# Patient Record
Sex: Female | Born: 1970 | Race: White | Hispanic: No | Marital: Single | Smoking: Never smoker
Health system: Southern US, Community
[De-identification: ages and names within clinical notes are randomized; demographics above are authoritative.]

## PROBLEM LIST (undated history)

## (undated) DIAGNOSIS — J45909 Unspecified asthma, uncomplicated: Secondary | ICD-10-CM

## (undated) DIAGNOSIS — H9190 Unspecified hearing loss, unspecified ear: Secondary | ICD-10-CM

## (undated) DIAGNOSIS — E7800 Pure hypercholesterolemia, unspecified: Secondary | ICD-10-CM

## (undated) DIAGNOSIS — F419 Anxiety disorder, unspecified: Secondary | ICD-10-CM

## (undated) HISTORY — PX: BREAST SURGERY: SHX581

---

## 2002-03-07 ENCOUNTER — Encounter: Payer: Self-pay | Admitting: Internal Medicine

## 2002-03-07 ENCOUNTER — Ambulatory Visit (HOSPITAL_COMMUNITY): Admission: RE | Admit: 2002-03-07 | Discharge: 2002-03-07 | Payer: Self-pay | Admitting: Internal Medicine

## 2002-03-14 ENCOUNTER — Ambulatory Visit (HOSPITAL_COMMUNITY): Admission: RE | Admit: 2002-03-14 | Discharge: 2002-03-14 | Payer: Self-pay | Admitting: Family Medicine

## 2002-03-14 ENCOUNTER — Encounter: Payer: Self-pay | Admitting: Family Medicine

## 2002-05-12 ENCOUNTER — Encounter: Payer: Self-pay | Admitting: Family Medicine

## 2002-05-12 ENCOUNTER — Ambulatory Visit (HOSPITAL_COMMUNITY): Admission: RE | Admit: 2002-05-12 | Discharge: 2002-05-12 | Payer: Self-pay | Admitting: Family Medicine

## 2002-08-10 ENCOUNTER — Encounter: Admission: RE | Admit: 2002-08-10 | Discharge: 2002-08-10 | Payer: Self-pay | Admitting: Obstetrics and Gynecology

## 2002-08-10 ENCOUNTER — Encounter: Payer: Self-pay | Admitting: Obstetrics and Gynecology

## 2002-08-21 ENCOUNTER — Encounter (HOSPITAL_BASED_OUTPATIENT_CLINIC_OR_DEPARTMENT_OTHER): Payer: Self-pay | Admitting: General Surgery

## 2002-08-23 ENCOUNTER — Ambulatory Visit (HOSPITAL_COMMUNITY): Admission: RE | Admit: 2002-08-23 | Discharge: 2002-08-23 | Payer: Self-pay | Admitting: General Surgery

## 2002-08-23 ENCOUNTER — Encounter (INDEPENDENT_AMBULATORY_CARE_PROVIDER_SITE_OTHER): Payer: Self-pay | Admitting: Specialist

## 2003-02-26 ENCOUNTER — Other Ambulatory Visit: Admission: RE | Admit: 2003-02-26 | Discharge: 2003-02-26 | Payer: Self-pay | Admitting: Obstetrics and Gynecology

## 2004-03-13 ENCOUNTER — Other Ambulatory Visit: Admission: RE | Admit: 2004-03-13 | Discharge: 2004-03-13 | Payer: Self-pay | Admitting: Obstetrics and Gynecology

## 2004-09-16 ENCOUNTER — Ambulatory Visit (HOSPITAL_COMMUNITY): Admission: RE | Admit: 2004-09-16 | Discharge: 2004-09-16 | Payer: Self-pay | Admitting: Family Medicine

## 2005-01-23 ENCOUNTER — Ambulatory Visit (HOSPITAL_COMMUNITY): Admission: RE | Admit: 2005-01-23 | Discharge: 2005-01-23 | Payer: Self-pay | Admitting: Family Medicine

## 2005-03-12 ENCOUNTER — Emergency Department (HOSPITAL_COMMUNITY): Admission: EM | Admit: 2005-03-12 | Discharge: 2005-03-12 | Payer: Self-pay | Admitting: Emergency Medicine

## 2005-03-19 ENCOUNTER — Ambulatory Visit (HOSPITAL_COMMUNITY): Admission: RE | Admit: 2005-03-19 | Discharge: 2005-03-19 | Payer: Self-pay | Admitting: Family Medicine

## 2005-03-23 ENCOUNTER — Ambulatory Visit (HOSPITAL_COMMUNITY): Admission: RE | Admit: 2005-03-23 | Discharge: 2005-03-23 | Payer: Self-pay | Admitting: Family Medicine

## 2006-07-20 ENCOUNTER — Ambulatory Visit (HOSPITAL_COMMUNITY): Admission: RE | Admit: 2006-07-20 | Discharge: 2006-07-20 | Payer: Self-pay | Admitting: Family Medicine

## 2007-01-07 ENCOUNTER — Emergency Department (HOSPITAL_COMMUNITY): Admission: EM | Admit: 2007-01-07 | Discharge: 2007-01-07 | Payer: Self-pay | Admitting: Emergency Medicine

## 2009-02-07 ENCOUNTER — Ambulatory Visit (HOSPITAL_COMMUNITY): Admission: RE | Admit: 2009-02-07 | Discharge: 2009-02-07 | Payer: Self-pay | Admitting: Internal Medicine

## 2010-06-06 NOTE — Procedures (Signed)
Nicole Wolfe, Nicole Wolfe             ACCOUNT NO.:  [id number]   MEDICAL RECORD NO.:  [id number]          PATIENT TYPE:  OUT   LOCATION:  [facility]                           FACILITY:  [facility]   PHYSICIAN:  Edward L. Juanetta Gosling, M.D.DATE OF BIRTH:  Sep 15, 1970   DATE OF PROCEDURE:  03/19/2005  DATE OF DISCHARGE:                              PULMONARY FUNCTION TEST   RESULTS:  1.  Spirometry is normal.  2.  Lung volumes are normal.  3.  Diffusion capacity of carbon monoxide (DLCO) is normal.  4.  Arterial blood gas is consistent with mild hyperventilation, otherwise      normal.      Edward L. Juanetta Gosling, M.D.  Electronically Signed     ELH/MEDQ  D:  03/26/2005  T:  03/26/2005  Job:  [id number]

## 2010-06-06 NOTE — Op Note (Signed)
[name], Nicole Wolfe                       ACCOUNT NO.:  [id number]   MEDICAL RECORD NO.:  [id number]                   PATIENT TYPE:  OIB   LOCATION:  2899                                 FACILITY:  [facility]   PHYSICIAN:  Leonie Man, M.D.                DATE OF BIRTH:  12/21/1970   DATE OF PROCEDURE:  08/23/2002  DATE OF DISCHARGE:  08/23/2002                                 OPERATIVE REPORT   PREOPERATIVE DIAGNOSIS:  Duct papillomatosis, right breast.   POSTOPERATIVE DIAGNOSIS:  Duct papillomatosis, right breast.   PROCEDURE:  Major duct excision, right breast.   SURGEON:  Mardene Celeste. Lurene Shadow, M.D.   ASSISTANT:  Nurse   ANESTHESIA:  General.   [name] is a 40 year old female presenting with right sided nipple  discharge which is heme positive.  She underwent a ductogram which showed  two filling defects within the major duct beneath the nipple.  At the time  of evaluation, there was no discharge and today at surgery, there was also  no discharge noted.  The patient was advised that the entire duct system  from under the nipple would be removed under the circumstances.  She  understands this and comes to the operating room for major duct excision.   PROCEDURE:  Following the induction of satisfactory general anesthesia, the  patient was positioned supine and the right breast was prepped and draped to  be included in the sterile operative field.  I made a circumareolar incision  on the inferior border of the areola and deepened through the skin and  subcutaneous tissue.  The skin of the nipple was raised cephalad as a flap  and dissection was carried down around carrying the dissection up to the  major duct system.  The duct system was dissected free from the under  surface of the nipple and carried down into the breast tissues for  approximately 5 cm deep to the central portion of the breast.  This was  removed in its entirety and forwarded for pathological  evaluation.  Hemostasis was then obtained with electrocautery.  Sponge, instrument and  sharp counts were verified.  The breast tissues were reapproximated with  concentric purse-string sutures of 2-0 Vicryl.  The subcutaneous tissue were  closed with interrupted 3-0 Vicryl sutures and the skin was closed with  running 5-0 Monocryl suture.  The wound was then reinforced with Steri-  Strips.  A sterile dressing was applied, the anesthetic reversed, and the  patient removed from the operating room to the recovery room in stable  condition, having tolerated the procedure well.                                               Leonie Man, M.D.    PB/MEDQ  D:  08/23/2002  T:  08/23/2002  Job:  [id number]

## 2012-02-28 ENCOUNTER — Encounter (HOSPITAL_COMMUNITY): Payer: Self-pay | Admitting: Emergency Medicine

## 2012-02-28 ENCOUNTER — Emergency Department (INDEPENDENT_AMBULATORY_CARE_PROVIDER_SITE_OTHER)
Admission: EM | Admit: 2012-02-28 | Discharge: 2012-02-28 | Disposition: A | Discharge status: Home / Self Care | Payer: Worker's Compensation | Source: Home / Self Care

## 2012-02-28 ENCOUNTER — Emergency Department (INDEPENDENT_AMBULATORY_CARE_PROVIDER_SITE_OTHER): Payer: Worker's Compensation

## 2012-02-28 DIAGNOSIS — M25539 Pain in unspecified wrist: Secondary | ICD-10-CM

## 2012-02-28 HISTORY — DX: Unspecified asthma, uncomplicated: J45.909

## 2012-02-28 HISTORY — DX: Anxiety disorder, unspecified: F41.9

## 2012-02-28 HISTORY — DX: Pure hypercholesterolemia, unspecified: E78.00

## 2012-02-28 HISTORY — DX: Unspecified hearing loss, unspecified ear: H91.90

## 2012-02-28 MED ORDER — RANITIDINE HCL 150 MG PO TABS: 150.0000 mg | ORAL_TABLET | Freq: Two times a day (BID) | ORAL | Status: DC

## 2012-02-28 NOTE — ED Notes (Signed)
Injured right wrist at work per patient.  Reports pushing a "fixture" at work.  Reports tingling sensation and increase in symptoms with the more she used wrist.

## 2012-02-28 NOTE — ED Provider Notes (Signed)
Nicole Wolfe is a 42 y.o. female who presents to Urgent Care today for right wrist pain starting this morning. Patient was working at [employer] pushing a cart. Following pushing a cart she noted older sided right wrist pain and some tingling and numbness sensations radiating to her second third and fourth digits.  She describes the tingling sensation is volar side. Her wrist pain is worse with ulnar deviation and flexion. She denies any falls or history is significant pain prior to this episode. She has not tried any medication yet.     PMH reviewed. Significant for hyperlipidemia, and anxiety History  Substance Use Topics  . Smoking status: Never Smoker   . Smokeless tobacco: Not on file  . Alcohol Use: No   works at [employer], unloads trucks ROS as above Medications reviewed. No current facility-administered medications for this encounter.   Current Outpatient Prescriptions  Medication Sig Dispense Refill  . Loratadine (CLARITIN PO) Take by mouth.      . sertraline (ZOLOFT) 50 MG tablet Take 50 mg by mouth daily.      Marland Kitchen SIMVASTATIN PO Take by mouth.      . ranitidine (ZANTAC) 150 MG tablet Take 1 tablet (150 mg total) by mouth 2 (two) times daily.  60 tablet  0    Exam:  BP 144/90  Pulse 91  Temp(Src) 97.8 F (36.6 C) (Oral)  Resp 21  SpO2 100%  LMP 02/21/2012 Gen: Well NAD RIGHT WRIST: Normal-appearing no swelling Mildly tender TFCC region. Nontender over anatomical snuff box.  Normal sensation pulses capillary refill and grip strength Positive Tinel's and Phalen's test right-sided. Neck: Nontender over spinal midline. Normal neck range of motion. Negative Spurling's test bilaterally.  No results found for this or any previous visit (from the past 24 hour(s)). Dg Wrist Complete Right  02/28/2012  *RADIOLOGY REPORT*  Clinical Data: Right wrist pain, pushing injury  RIGHT WRIST - COMPLETE 3+ VIEW  Comparison: None.  Findings: Multiple views of the right wrist including dedicated  scaphoid view demonstrate no evidence of acute fracture, malalignment or focal soft tissue swelling.  The pronator quadratus fat pad is sharp and well delineated.  The carpus is intact. Normal bony mineralization.  IMPRESSION: Negative radiographs of the right wrist   Original Report Authenticated By: Malachy Moan, M.D.     Assessment and Plan: 42 y.o. female with right wrist pain. Somewhat concerning for carpal tunnel syndrome.  Additionally ulnar sided pain concerning for overuse injury. Plan: Light duty work restriction for 2 weeks NSAIDs x2 weeks Simple wrist brace for 2 week If not improved followup with orthopedics, if improved followup here for full return to duties.      Rodolph Bong, MD 02/28/12 [phone]

## 2012-03-01 NOTE — ED Provider Notes (Signed)
Medical screening examination/treatment/procedure(s) were performed by resident physician or non-physician practitioner and as supervising physician I was immediately available for consultation/collaboration.   Rolando Hessling DOUGLAS MD.   Kenyatte Chatmon D Rochell Mabie, MD 03/01/12 1111 

## 2013-04-13 ENCOUNTER — Emergency Department (HOSPITAL_BASED_OUTPATIENT_CLINIC_OR_DEPARTMENT_OTHER): Payer: Worker's Compensation

## 2013-04-13 ENCOUNTER — Emergency Department (HOSPITAL_BASED_OUTPATIENT_CLINIC_OR_DEPARTMENT_OTHER)
Admission: EM | Admit: 2013-04-13 | Discharge: 2013-04-13 | Disposition: A | Discharge status: Home / Self Care | Payer: Worker's Compensation | Attending: Emergency Medicine | Admitting: Emergency Medicine

## 2013-04-13 DIAGNOSIS — S46909A Unspecified injury of unspecified muscle, fascia and tendon at shoulder and upper arm level, unspecified arm, initial encounter: Secondary | ICD-10-CM | POA: Insufficient documentation

## 2013-04-13 DIAGNOSIS — J45909 Unspecified asthma, uncomplicated: Secondary | ICD-10-CM | POA: Diagnosis not present

## 2013-04-13 DIAGNOSIS — Y9289 Other specified places as the place of occurrence of the external cause: Secondary | ICD-10-CM | POA: Insufficient documentation

## 2013-04-13 DIAGNOSIS — W208XXA Other cause of strike by thrown, projected or falling object, initial encounter: Secondary | ICD-10-CM | POA: Diagnosis not present

## 2013-04-13 DIAGNOSIS — S46919A Strain of unspecified muscle, fascia and tendon at shoulder and upper arm level, unspecified arm, initial encounter: Secondary | ICD-10-CM

## 2013-04-13 DIAGNOSIS — Z79899 Other long term (current) drug therapy: Secondary | ICD-10-CM | POA: Diagnosis not present

## 2013-04-13 DIAGNOSIS — S4980XA Other specified injuries of shoulder and upper arm, unspecified arm, initial encounter: Secondary | ICD-10-CM | POA: Insufficient documentation

## 2013-04-13 DIAGNOSIS — Y939 Activity, unspecified: Secondary | ICD-10-CM | POA: Insufficient documentation

## 2013-04-13 DIAGNOSIS — IMO0002 Reserved for concepts with insufficient information to code with codable children: Secondary | ICD-10-CM | POA: Insufficient documentation

## 2013-04-13 DIAGNOSIS — E7800 Pure hypercholesterolemia, unspecified: Secondary | ICD-10-CM | POA: Diagnosis not present

## 2013-04-13 DIAGNOSIS — F411 Generalized anxiety disorder: Secondary | ICD-10-CM | POA: Diagnosis not present

## 2013-04-13 DIAGNOSIS — Z880 Allergy status to penicillin: Secondary | ICD-10-CM | POA: Insufficient documentation

## 2013-04-13 DIAGNOSIS — Y990 Civilian activity done for income or pay: Secondary | ICD-10-CM | POA: Insufficient documentation

## 2013-04-13 DIAGNOSIS — Z8669 Personal history of other diseases of the nervous system and sense organs: Secondary | ICD-10-CM | POA: Diagnosis not present

## 2013-04-13 DIAGNOSIS — M549 Dorsalgia, unspecified: Secondary | ICD-10-CM

## 2013-04-13 MED ORDER — CYCLOBENZAPRINE HCL 5 MG PO TABS: 5.0000 mg | ORAL_TABLET | Freq: Two times a day (BID) | ORAL | Status: DC | PRN

## 2013-04-13 MED ORDER — HYDROCODONE-ACETAMINOPHEN 5-325 MG PO TABS: 1.0000 | ORAL_TABLET | ORAL | Status: DC | PRN

## 2013-04-13 NOTE — ED Provider Notes (Signed)
CSN: [id number]     Arrival date & time 04/13/13  1221 History   First MD Initiated Contact with Patient 04/13/13 1227     Chief Complaint  Patient presents with  . Shoulder Pain     (Consider location/radiation/quality/duration/timing/severity/associated sxs/prior Treatment) HPI Comments: Pt states that some boxes fell off the shelf onto her left shoulder and upper back 10 days ago. Pt states that she is continuing to have pain in the area with movement and she feels very stiff.  Patient is a 43 y.o. female presenting with shoulder pain. The history is provided by the patient. No language interpreter was used.  Shoulder Pain This is a new problem. The current episode started 1 to 4 weeks ago. The problem occurs constantly. The problem has been unchanged. Pertinent negatives include no fever. Exacerbated by: movement. She has tried NSAIDs for the symptoms. The treatment provided no relief.    Past Medical History  Diagnosis Date  . High cholesterol   . Asthma   . Anxiety   . Hearing loss     congenital nerve damage, familial trait   Past Surgical History  Procedure Laterality Date  . Breast surgery     No family history on file. History  Substance Use Topics  . Smoking status: Never Smoker   . Smokeless tobacco: Not on file  . Alcohol Use: No   OB History   Grav Para Term Preterm Abortions TAB SAB Ect Mult Living                 Review of Systems  Constitutional: Negative for fever.  Respiratory: Negative.   Cardiovascular: Negative.       Allergies  Ibuprofen; Keflex; Penicillins; and Sulfa antibiotics  Home Medications   Current Outpatient Rx  Name  Route  Sig  Dispense  Refill  . Loratadine (CLARITIN PO)   Oral   Take by mouth.         . ranitidine (ZANTAC) 150 MG tablet   Oral   Take 1 tablet (150 mg total) by mouth 2 (two) times daily.   60 tablet   0   . sertraline (ZOLOFT) 50 MG tablet   Oral   Take 50 mg by mouth daily.         Marland Kitchen  SIMVASTATIN PO   Oral   Take by mouth.          BP 126/82  Pulse 82  Temp(Src) 98.3 F (36.8 C) (Oral)  Resp 18  SpO2 99%  LMP 04/06/2013 Physical Exam  Nursing note and vitals reviewed. Constitutional: She appears well-developed and well-nourished.  Cardiovascular: Normal rate and regular rhythm.   Pulmonary/Chest: Effort normal and breath sounds normal.  Musculoskeletal:  Pt tender in the lateral left shoulder and along the scapula and cervical parsspinal area. Pt has full rom. No gross deformity noted  Neurological: She exhibits normal muscle tone. Coordination normal.  Skin: Skin is warm and dry.  Psychiatric: She has a normal mood and affect.    ED Course  Procedures (including critical care time) Labs Review Labs Reviewed - No data to display Imaging Review Dg Scapula Left  04/13/2013   CLINICAL DATA:  Shoulder pain  EXAM: LEFT SCAPULA - 2+ VIEWS  COMPARISON:  None.  FINDINGS: There is no evidence of fracture or other focal bone lesions. Soft tissues are unremarkable.  IMPRESSION: Negative.   Electronically Signed   By: Sherian Rein M.D.   On: 04/13/2013 13:15   Dg  Shoulder Left  04/13/2013   CLINICAL DATA:  Traumatic injury and pain  EXAM: LEFT SHOULDER - 2+ VIEW  COMPARISON:  None.  FINDINGS: There is no evidence of fracture or dislocation. There is no evidence of arthropathy or other focal bone abnormality. Soft tissues are unremarkable.  IMPRESSION: No acute abnormality is noted.   Electronically Signed   By: Alcide CleverMark  Lukens M.D.   On: 04/13/2013 13:16     EKG Interpretation None      MDM   Final diagnoses:  Shoulder strain  Back pain    No evidence of bony abnormality. Pt is neurologically intact. Will treat symptomatically with hydrocodone and flexeril and pt can follow up with Dr.  Pearletha Forgehudnall as needed    Teressa LowerVrinda Carsyn Boster, NP 04/13/13 1329

## 2013-04-13 NOTE — ED Provider Notes (Signed)
Medical screening examination/treatment/procedure(s) were performed by non-physician practitioner and as supervising physician I was immediately available for consultation/collaboration.   EKG Interpretation None        William Shavette Shoaff, MD 04/13/13 1451 

## 2013-04-13 NOTE — ED Notes (Signed)
Pt amb to room 3 with quick steady gait in nad. Pt reports boxes of heavy items falling onto her left shoulder at work today. Pt assisted into gown for md exam. Denies any other injuries or c/o.

## 2013-04-13 NOTE — Discharge Instructions (Signed)
Back Pain, Adult Back pain is very common. The pain often gets better over time. The cause of back pain is usually not dangerous. Most people can learn to manage their back pain on their own.  HOME CARE   Stay active. Start with short walks on flat ground if you can. Try to walk farther each day.  Do not sit, drive, or stand in one place for more than 30 minutes. Do not stay in bed.  Do not avoid exercise or work. Activity can help your back heal faster.  Be careful when you bend or lift an object. Bend at your knees, keep the object close to you, and do not twist.  Sleep on a firm mattress. Lie on your side, and bend your knees. If you lie on your back, put a pillow under your knees.  Only take medicines as told by your doctor.  Put ice on the injured area.  Put ice in a plastic bag.  Place a towel between your skin and the bag.  Leave the ice on for 15-20 minutes, 03-04 times a day for the first 2 to 3 days. After that, you can switch between ice and heat packs.  Ask your doctor about back exercises or massage.  Avoid feeling anxious or stressed. Find good ways to deal with stress, such as exercise. GET HELP RIGHT AWAY IF:   Your pain does not go away with rest or medicine.  Your pain does not go away in 1 week.  You have new problems.  You do not feel well.  The pain spreads into your legs.  You cannot control when you poop (bowel movement) or pee (urinate).  Your arms or legs feel weak or lose feeling (numbness).  You feel sick to your stomach (nauseous) or throw up (vomit).  You have belly (abdominal) pain.  You feel like you may pass out (faint). MAKE SURE YOU:   Understand these instructions.  Will watch your condition.  Will get help right away if you are not doing well or get worse. Document Released: 06/24/2007 Document Revised: 03/30/2011 Document Reviewed: 05/26/2010 [facility] Patient Information 2014 [place]ExitCare, [place].  Muscle Strain A muscle  strain is an injury that occurs when a muscle is stretched beyond its normal length. Usually a small number of muscle fibers are torn when this happens. Muscle strain is rated in degrees. First-degree strains have the least amount of muscle fiber tearing and pain. Second-degree and third-degree strains have increasingly more tearing and pain.  Usually, recovery from muscle strain takes 1 2 weeks. Complete healing takes 5 6 weeks.  CAUSES  Muscle strain happens when a sudden, violent force placed on a muscle stretches it too far. This may occur with lifting, sports, or a fall.  RISK FACTORS Muscle strain is especially common in athletes.  SIGNS AND SYMPTOMS At the site of the muscle strain, there may be:  Pain.  Bruising.  Swelling.  Difficulty using the muscle due to pain or lack of normal function. DIAGNOSIS  Your health care provider will perform a physical exam and ask about your medical history. TREATMENT  Often, the best treatment for a muscle strain is resting, icing, and applying cold compresses to the injured area.  HOME CARE INSTRUCTIONS   Use the PRICE method of treatment to promote muscle healing during the first 2 3 days after your injury. The PRICE method involves:  Protecting the muscle from being injured again.  Restricting your activity and resting the injured body  part.  Icing your injury. To do this, put ice in a plastic bag. Place a towel between your skin and the bag. Then, apply the ice and leave it on from 15 20 minutes each hour. After the third day, switch to moist heat packs.  Apply compression to the injured area with a splint or elastic bandage. Be careful not to wrap it too tightly. This may interfere with blood circulation or increase swelling.  Elevate the injured body part above the level of your heart as often as you can.  Only take over-the-counter or prescription medicines for pain, discomfort, or fever as directed by your health care  provider.  Warming up prior to exercise helps to prevent future muscle strains. SEEK MEDICAL CARE IF:   You have increasing pain or swelling in the injured area.  You have numbness, tingling, or a significant loss of strength in the injured area. MAKE SURE YOU:   Understand these instructions.  Will watch your condition.  Will get help right away if you are not doing well or get worse. Document Released: 01/05/2005 Document Revised: 10/26/2012 Document Reviewed: 08/04/2012 [facility] Patient Information 2014 [place], [place].

## 2013-08-31 ENCOUNTER — Other Ambulatory Visit: Payer: Self-pay | Admitting: Obstetrics and Gynecology

## 2013-09-04 LAB — CYTOLOGY - PAP

## 2014-04-03 ENCOUNTER — Other Ambulatory Visit: Payer: Worker's Compensation

## 2014-04-03 DIAGNOSIS — S300XXD Contusion of lower back and pelvis, subsequent encounter: Secondary | ICD-10-CM | POA: Diagnosis not present

## 2014-04-03 DIAGNOSIS — S338XXD Sprain of other parts of lumbar spine and pelvis, subsequent encounter: Secondary | ICD-10-CM | POA: Diagnosis not present

## 2014-04-03 DIAGNOSIS — M545 Low back pain: Secondary | ICD-10-CM | POA: Diagnosis not present

## 2014-06-09 ENCOUNTER — Ambulatory Visit (INDEPENDENT_AMBULATORY_CARE_PROVIDER_SITE_OTHER): Payer: Worker's Compensation | Admitting: Family Medicine

## 2014-06-09 ENCOUNTER — Ambulatory Visit: Payer: Self-pay

## 2014-06-09 VITALS — BP 112/68 | HR 107 | Temp 99.4°F | Resp 20 | Ht 60.0 in | Wt 183.0 lb

## 2014-06-09 DIAGNOSIS — S39012D Strain of muscle, fascia and tendon of lower back, subsequent encounter: Secondary | ICD-10-CM | POA: Diagnosis not present

## 2014-06-09 NOTE — Patient Instructions (Signed)
Continue regular duties and return only if needed

## 2014-06-09 NOTE — Progress Notes (Signed)
Worker's Comp.  History: Patient had injured her back when boxes fell against her back at her job at [employer]. She was to complete her physical therapy and return for her final Worker's Comp. visit, and had forgotten to do so. The employment made her come on back over for one more recheck in final clearance. She actually works 2 jobs, additionally working at [employer]. She is doing well now, not on any medications for the pain.  Objective: Good range of motion of lower lumbar spine. Gait normal.  Assessment: Lumbar strain, resolved  Plan: Full employment Return if further problems develop at any time.

## 2014-06-13 ENCOUNTER — Emergency Department (HOSPITAL_COMMUNITY): Payer: 59

## 2014-06-13 ENCOUNTER — Emergency Department (HOSPITAL_COMMUNITY)
Admission: EM | Admit: 2014-06-13 | Discharge: 2014-06-13 | Disposition: A | Discharge status: Home / Self Care | Payer: 59 | Attending: Emergency Medicine | Admitting: Emergency Medicine

## 2014-06-13 ENCOUNTER — Encounter (HOSPITAL_COMMUNITY): Payer: Self-pay | Admitting: *Deleted

## 2014-06-13 DIAGNOSIS — J029 Acute pharyngitis, unspecified: Secondary | ICD-10-CM

## 2014-06-13 DIAGNOSIS — B349 Viral infection, unspecified: Secondary | ICD-10-CM | POA: Diagnosis not present

## 2014-06-13 DIAGNOSIS — R059 Cough, unspecified: Secondary | ICD-10-CM

## 2014-06-13 DIAGNOSIS — F419 Anxiety disorder, unspecified: Secondary | ICD-10-CM | POA: Diagnosis not present

## 2014-06-13 DIAGNOSIS — H9190 Unspecified hearing loss, unspecified ear: Secondary | ICD-10-CM | POA: Diagnosis not present

## 2014-06-13 DIAGNOSIS — J45909 Unspecified asthma, uncomplicated: Secondary | ICD-10-CM | POA: Diagnosis not present

## 2014-06-13 DIAGNOSIS — Z79899 Other long term (current) drug therapy: Secondary | ICD-10-CM | POA: Insufficient documentation

## 2014-06-13 DIAGNOSIS — Z880 Allergy status to penicillin: Secondary | ICD-10-CM | POA: Insufficient documentation

## 2014-06-13 DIAGNOSIS — Z8639 Personal history of other endocrine, nutritional and metabolic disease: Secondary | ICD-10-CM | POA: Diagnosis not present

## 2014-06-13 DIAGNOSIS — R05 Cough: Secondary | ICD-10-CM

## 2014-06-13 LAB — RAPID STREP SCREEN (MED CTR MEBANE ONLY): STREPTOCOCCUS, GROUP A SCREEN (DIRECT): NEGATIVE

## 2014-06-13 MED ORDER — DOXYCYCLINE HYCLATE 100 MG PO CAPS: 100.0000 mg | ORAL_CAPSULE | Freq: Two times a day (BID) | ORAL | Status: AC

## 2014-06-13 NOTE — ED Notes (Signed)
Pt states she was treated with Z PAK and completed it today. States has "felt bad" . States HAS been able to eat and drink.

## 2014-06-13 NOTE — Discharge Instructions (Signed)
Nicole Wolfe Kitchen. Pharyngitis Pharyngitis is redness, pain, and swelling (inflammation) of your pharynx.  CAUSES  Pharyngitis is usually caused by infection. Most of the time, these infections are from viruses (viral) and are part of a cold. However, sometimes pharyngitis is caused by bacteria (bacterial). Pharyngitis can also be caused by allergies. Viral pharyngitis may be spread from person to person by coughing, sneezing, and personal items or utensils (cups, forks, spoons, toothbrushes). Bacterial pharyngitis may be spread from person to person by more intimate contact, such as kissing.  SIGNS AND SYMPTOMS  Symptoms of pharyngitis include:   Sore throat.   Tiredness (fatigue).   Low-grade fever.   Headache.  Joint pain and muscle aches.  Skin rashes.  Swollen lymph nodes.  Plaque-like film on throat or tonsils (often seen with bacterial pharyngitis). DIAGNOSIS  Your health care provider will ask you questions about your illness and your symptoms. Your medical history, along with a physical exam, is often all that is needed to diagnose pharyngitis. Sometimes, a rapid strep test is done. Other lab tests may also be done, depending on the suspected cause.  TREATMENT  Viral pharyngitis will usually get better in 3-4 days without the use of medicine. Bacterial pharyngitis is treated with medicines that kill germs (antibiotics).  HOME CARE INSTRUCTIONS   Drink enough water and fluids to keep your urine clear or pale yellow.   Only take over-the-counter or prescription medicines as directed by your health care provider:   If you are prescribed antibiotics, make sure you finish them even if you start to feel better.   Do not take aspirin.   Get lots of rest.   Gargle with 8 oz of salt water ( tsp of salt per 1 qt of water) as often as every 1-2 hours to soothe your throat.   Throat lozenges (if you are not at risk for choking) or sprays may be used to soothe your throat. SEEK  MEDICAL CARE IF:   You have large, tender lumps in your neck.  You have a rash.  You cough up green, yellow-brown, or bloody spit. SEEK IMMEDIATE MEDICAL CARE IF:   Your neck becomes stiff.  You drool or are unable to swallow liquids.  You vomit or are unable to keep medicines or liquids down.  You have severe pain that does not go away with the use of recommended medicines.  You have trouble breathing (not caused by a stuffy nose). MAKE SURE YOU:   Understand these instructions.  Will watch your condition.  Will get help right away if you are not doing well or get worse. Document Released: 01/05/2005 Document Revised: 10/26/2012 Document Reviewed: 09/12/2012 [facility] Patient Information 2015 [place]ExitCare, [place]LLC. This information is not intended to replace advice given to you by your health care provider. Make sure you discuss any questions you have with your health care provider.  Viral Infections A viral infection can be caused by different types of viruses.Most viral infections are not serious and resolve on their own. However, some infections may cause severe symptoms and may lead to further complications. SYMPTOMS Viruses can frequently cause:  Minor sore throat.  Aches and pains.  Headaches.  Runny nose.  Different types of rashes.  Watery eyes.  Tiredness.  Cough.  Loss of appetite.  Gastrointestinal infections, resulting in nausea, vomiting, and diarrhea. These symptoms do not respond to antibiotics because the infection is not caused by bacteria. However, you might catch a bacterial infection following the viral infection. This is sometimes  a "superinfection." Symptoms of such a bacterial infection may include: °· Worsening sore throat with pus and difficulty swallowing. °· Swollen neck glands. °· Chills and a high or persistent fever. °· Severe headache. °· Tenderness over the sinuses. °· Persistent overall ill feeling (malaise), muscle aches, and tiredness  (fatigue). °· Persistent cough. °· Yellow, green, or brown mucus production with coughing. °HOME CARE INSTRUCTIONS  °· Only take over-the-counter or prescription medicines for pain, discomfort, diarrhea, or fever as directed by your caregiver. °· Drink enough water and fluids to keep your urine clear or pale yellow. Sports drinks can provide valuable electrolytes, sugars, and hydration. °· Get plenty of rest and maintain proper nutrition. Soups and broths with crackers or rice are fine. °SEEK IMMEDIATE MEDICAL CARE IF:  °· You have severe headaches, shortness of breath, chest pain, neck pain, or an unusual rash. °· You have uncontrolled vomiting, diarrhea, or you are unable to keep down fluids. °· You or your child has an oral temperature above 102° F (38.9° C), not controlled by medicine. °· Your baby is older than 3 months with a rectal temperature of 102° F (38.9° C) or higher. °· Your baby is 3 months old or younger with a rectal temperature of 100.4° F (38° C) or higher. °MAKE SURE YOU:  °· Understand these instructions. °· Will watch your condition. °· Will get help right away if you are not doing well or get worse. °Document Released: 10/15/2004 Document Revised: 03/30/2011 Document Reviewed: 05/12/2010 °ExitCare® Patient Information ©2015 Exit[employer], LLC. This information is not intended to replace advice given to you by your health care provider. Make sure you discuss any questions you have with your health care provider. ° °

## 2014-06-13 NOTE — ED Provider Notes (Signed)
CSN: [id number]     Arrival date & time 06/13/14  1209 History  This chart was scribed for non-physician practitioner, Arthor CaptainAbigail Guerin Lashomb, PA-C working with Bethann BerkshireJoseph Zammit, MD by Doreatha MartinEva Mathews, ED scribe. This patient was seen in room TR03C/TR03C and the patient's care was started at 2:19 PM    Chief Complaint  Patient presents with  . Sore Throat   The history is provided by the patient. No language interpreter was used.   HPI Comments: Nicole Wolfe is a 44 y.o. female with Hx of asthma who presents to the Emergency Department complaining of intermittent fatigue onset 4 days ago with an associated, greatly improved sore throat. Pt also notes associated intermittent diarrhea, intermittent cough, fatigue and subjective fever. She states that sore throat is exacerbated by swallowing and acidic foods. She believes that her cough has been exacerbated by her albuterol treatments. Pt states she saw her PCP 4 days PTA and was prescribed a Z PAK and Prednisone which relieved her Sx. She also reports that 2 days PTA she was given a breathing Tx and a pulmonary stress test.  Pt also reports edema in the neck, otalgia, and jaw pain that were resolved prior to onset of sore throat. She states that she took albuterol PTA with mild relief of Sx. She states that she takes Claritin and Sertraline QD at home and has not tried other OTC medications for this complaint. She denies wheezing, decreased appetite and thirst, hematuria, dysuria, frequency, urgency, nausea, vomiting or any known tick bites.  Past Medical History  Diagnosis Date  . High cholesterol   . Asthma   . Anxiety   . Hearing loss     congenital nerve damage, familial trait   Past Surgical History  Procedure Laterality Date  . Breast surgery     History reviewed. No pertinent family history. History  Substance Use Topics  . Smoking status: Never Smoker   . Smokeless tobacco: Never Used  . Alcohol Use: No   OB History    No data available      Review of Systems  Constitutional: Positive for fever and fatigue.  HENT: Positive for sore throat. Negative for trouble swallowing.   Respiratory: Positive for cough. Negative for wheezing.   Gastrointestinal: Positive for diarrhea. Negative for nausea and vomiting.  Genitourinary: Negative for dysuria, urgency, frequency and difficulty urinating.      Allergies  Ibuprofen; Keflex; Macrobid; Penicillins; and Sulfa antibiotics  Home Medications   Prior to Admission medications   Medication Sig Start Date End Date Taking? Authorizing Provider  Loratadine (CLARITIN PO) Take by mouth.    Historical Provider, MD  sertraline (ZOLOFT) 50 MG tablet Take 50 mg by mouth daily.    Historical Provider, MD   BP 143/96 mmHg  Pulse 112  Temp(Src) 98.3 F (36.8 C) (Oral)  Resp 18  Ht 5' (1.524 m)  Wt 183 lb (83.008 kg)  BMI 35.74 kg/m2  SpO2 98%  LMP 05/20/2014 Physical Exam  Constitutional: She is oriented to person, place, and time. She appears well-developed and well-nourished. No distress.  HENT:  Head: Normocephalic and atraumatic.  Mouth/Throat: No oropharyngeal exudate.  Mild erythema in the pharynx. No petechiae.   Eyes: Conjunctivae and EOM are normal.  Neck: Neck supple.  Cardiovascular: Normal rate.   Pulmonary/Chest: Breath sounds normal. No respiratory distress.  Neurological: She is alert and oriented to person, place, and time.  Skin: Skin is warm.  Psychiatric: Her behavior is normal.  Nursing note  and vitals reviewed.   ED Course  Procedures (including critical care time) DIAGNOSTIC STUDIES: Oxygen Saturation is 98% on RA, normal by my interpretation.    COORDINATION OF CARE: 2:28 PM Discussed plan to order CXR.  Patient acknowledges and agrees with plan.     Labs Review Labs Reviewed  RAPID STREP SCREEN  CULTURE, GROUP A STREP    Imaging Review No results found.   EKG Interpretation None     MDM   Final diagnoses:  Cough  Pharyngitis   Viral infection   Patient with flulike illness in the summertime. She is a negative chest x-ray. She does not seem to be improving. This may be a simple viral infection. However, given risk for [facility] spotted fever in this area. I will treat the patient prophylactically with 14 days of doxycycline. I have discussed the fact that the patient needs to avoid direct sunlight as her risk for sunburn is increased on doxycycline. Patient understands. She appears safe for discharge at this time  I personally performed the services described in this documentation, which was scribed in my presence. The recorded information has been reviewed and is accurate.      Arthor Captain, PA-C 06/19/14 1958  Bethann Berkshire, MD 06/21/14 [phone]

## 2014-06-13 NOTE — ED Notes (Signed)
Pt reports sore throat and not feeling well since Saturday. Was put on antibiotics but no relief. Still has sore throat, fatigue and right ear pain. Reports recent low grade fever. Airway intact.

## 2014-06-15 LAB — CULTURE, GROUP A STREP

## 2014-10-01 ENCOUNTER — Other Ambulatory Visit: Payer: Self-pay | Admitting: Obstetrics & Gynecology

## 2014-10-01 DIAGNOSIS — R928 Other abnormal and inconclusive findings on diagnostic imaging of breast: Secondary | ICD-10-CM

## 2014-10-04 ENCOUNTER — Ambulatory Visit
Admission: RE | Admit: 2014-10-04 | Discharge: 2014-10-04 | Disposition: A | Discharge status: Home / Self Care | Payer: 59 | Source: Ambulatory Visit | Attending: Obstetrics & Gynecology | Admitting: Obstetrics & Gynecology

## 2014-10-04 DIAGNOSIS — R928 Other abnormal and inconclusive findings on diagnostic imaging of breast: Secondary | ICD-10-CM

## 2015-03-31 ENCOUNTER — Encounter (HOSPITAL_COMMUNITY): Payer: Self-pay | Admitting: Emergency Medicine

## 2015-03-31 ENCOUNTER — Emergency Department (HOSPITAL_COMMUNITY)
Admission: EM | Admit: 2015-03-31 | Discharge: 2015-03-31 | Disposition: A | Discharge status: Home / Self Care | Payer: Managed Care, Other (non HMO) | Attending: Emergency Medicine | Admitting: Emergency Medicine

## 2015-03-31 ENCOUNTER — Emergency Department (HOSPITAL_COMMUNITY): Payer: Managed Care, Other (non HMO)

## 2015-03-31 DIAGNOSIS — J45909 Unspecified asthma, uncomplicated: Secondary | ICD-10-CM | POA: Insufficient documentation

## 2015-03-31 DIAGNOSIS — H9190 Unspecified hearing loss, unspecified ear: Secondary | ICD-10-CM | POA: Insufficient documentation

## 2015-03-31 DIAGNOSIS — Z880 Allergy status to penicillin: Secondary | ICD-10-CM | POA: Diagnosis not present

## 2015-03-31 DIAGNOSIS — Z792 Long term (current) use of antibiotics: Secondary | ICD-10-CM | POA: Diagnosis not present

## 2015-03-31 DIAGNOSIS — Z8639 Personal history of other endocrine, nutritional and metabolic disease: Secondary | ICD-10-CM | POA: Insufficient documentation

## 2015-03-31 DIAGNOSIS — Z3202 Encounter for pregnancy test, result negative: Secondary | ICD-10-CM | POA: Insufficient documentation

## 2015-03-31 DIAGNOSIS — R1011 Right upper quadrant pain: Secondary | ICD-10-CM

## 2015-03-31 DIAGNOSIS — Z79899 Other long term (current) drug therapy: Secondary | ICD-10-CM | POA: Insufficient documentation

## 2015-03-31 DIAGNOSIS — F419 Anxiety disorder, unspecified: Secondary | ICD-10-CM | POA: Insufficient documentation

## 2015-03-31 HISTORY — DX: Unspecified hearing loss, unspecified ear: H91.90

## 2015-03-31 LAB — URINALYSIS, ROUTINE W REFLEX MICROSCOPIC
BILIRUBIN URINE: NEGATIVE
Glucose, UA: NEGATIVE mg/dL
Ketones, ur: 40 mg/dL — AB
Leukocytes, UA: NEGATIVE
NITRITE: NEGATIVE
PROTEIN: NEGATIVE mg/dL
Specific Gravity, Urine: 1.011 (ref 1.005–1.030)
pH: 6 (ref 5.0–8.0)

## 2015-03-31 LAB — COMPREHENSIVE METABOLIC PANEL
ALK PHOS: 78 U/L (ref 38–126)
ALT: 10 U/L — AB (ref 14–54)
AST: 15 U/L (ref 15–41)
Albumin: 3.8 g/dL (ref 3.5–5.0)
Anion gap: 16 — ABNORMAL HIGH (ref 5–15)
BILIRUBIN TOTAL: 0.3 mg/dL (ref 0.3–1.2)
BUN: 9 mg/dL (ref 6–20)
CALCIUM: 8.9 mg/dL (ref 8.9–10.3)
CO2: 22 mmol/L (ref 22–32)
CREATININE: 0.6 mg/dL (ref 0.44–1.00)
Chloride: 101 mmol/L (ref 101–111)
GFR calc non Af Amer: >60 mL/min (ref >60)
GLUCOSE: 155 mg/dL — AB (ref 65–99)
Potassium: 3.6 mmol/L (ref 3.5–5.1)
SODIUM: 139 mmol/L (ref 135–145)
TOTAL PROTEIN: 7.5 g/dL (ref 6.5–8.1)

## 2015-03-31 LAB — CBC
HCT: 42.5 % (ref 36.0–46.0)
Hemoglobin: 13.7 g/dL (ref 12.0–15.0)
MCH: 28.7 pg (ref 26.0–34.0)
MCHC: 32.2 g/dL (ref 30.0–36.0)
MCV: 89.1 fL (ref 78.0–100.0)
PLATELETS: 278 10*3/uL (ref 150–400)
RBC: 4.77 MIL/uL (ref 3.87–5.11)
RDW: 12.9 % (ref 11.5–15.5)
WBC: 11.9 10*3/uL — ABNORMAL HIGH (ref 4.0–10.5)

## 2015-03-31 LAB — URINE MICROSCOPIC-ADD ON

## 2015-03-31 LAB — PREGNANCY, URINE: Preg Test, Ur: NEGATIVE

## 2015-03-31 LAB — LIPASE, BLOOD: LIPASE: 39 U/L (ref 11–51)

## 2015-03-31 MED ORDER — MORPHINE SULFATE (PF) 2 MG/ML IV SOLN
2.0000 mg | Freq: Once | INTRAVENOUS | Status: DC
  Filled 2015-03-31: qty 1

## 2015-03-31 MED ORDER — SODIUM CHLORIDE 0.9 % IV BOLUS (SEPSIS)
500.0000 mL | Freq: Once | INTRAVENOUS | Status: AC
  Administered 2015-03-31: 500 mL via INTRAVENOUS

## 2015-03-31 NOTE — ED Provider Notes (Signed)
CSN: [id number]     Arrival date & time 03/31/15  1416 History   First MD Initiated Contact with Patient 03/31/15 1550     Chief Complaint  Patient presents with  . Abdominal Pain     (Consider location/radiation/quality/duration/timing/severity/associated sxs/prior Treatment) HPI   Eden Toohey is a 45 year old female with a past medical history of HLD, anxiety who presents to the emergency department today complaining of abdominal pain patient states that she has been experiencing a dull, achy pain in her right upper quadrant that is progressively worsening. Patient states that the pain is worsened with sitting up and approximately 30 minutes after eating. No association with fatty foods. No prior history of abdominal surgeries. Patient denies nausea, vomiting, diarrhea, melena, hematochezia, dysuria, vaginal discharge, vaginal bleeding. Last menstrual period was 2 weeks ago.  Past Medical History  Diagnosis Date  . High cholesterol   . Asthma   . Anxiety   . Hearing loss     congenital nerve damage, familial trait  . Hard of hearing    Past Surgical History  Procedure Laterality Date  . Breast surgery     No family history on file. Social History  Substance Use Topics  . Smoking status: Never Smoker   . Smokeless tobacco: Never Used  . Alcohol Use: No   OB History    No data available     Review of Systems  All other systems reviewed and are negative.     Allergies  Ibuprofen; Keflex; Macrobid; Penicillins; and Sulfa antibiotics  Home Medications   Prior to Admission medications   Medication Sig Start Date End Date Taking? Authorizing Provider  doxycycline (VIBRAMYCIN) 100 MG capsule Take 1 capsule (100 mg total) by mouth 2 (two) times daily. 06/13/14   Arthor Captain, PA-C  Loratadine (CLARITIN PO) Take by mouth.    Historical Provider, MD  sertraline (ZOLOFT) 50 MG tablet Take 50 mg by mouth daily.    Historical Provider, MD   BP 143/81 mmHg  Pulse 91   Temp(Src) 97.8 F (36.6 C)  Resp 16  Ht 5' (1.524 m)  Wt 81.761 kg  BMI 35.20 kg/m2  SpO2 100%  LMP 03/17/2015 Physical Exam  Constitutional: She is oriented to person, place, and time. She appears well-developed and well-nourished. No distress.  HENT:  Head: Normocephalic and atraumatic.  Mouth/Throat: No oropharyngeal exudate.  Eyes: Conjunctivae and EOM are normal. Pupils are equal, round, and reactive to light. Right eye exhibits no discharge. Left eye exhibits no discharge. No scleral icterus.  Cardiovascular: Normal rate, regular rhythm, normal heart sounds and intact distal pulses.  Exam reveals no gallop and no friction rub.   No murmur heard. Pulmonary/Chest: Effort normal and breath sounds normal. No respiratory distress. She has no wheezes. She has no rales. She exhibits no tenderness.  Abdominal: Soft. Normal appearance and bowel sounds are normal. She exhibits no distension, no ascites and no mass. There is tenderness ( Moderate). There is no rigidity, no rebound, no guarding, no CVA tenderness, no tenderness at McBurney's point and negative Murphy's sign. No hernia.    Musculoskeletal: Normal range of motion. She exhibits no edema.  Neurological: She is alert and oriented to person, place, and time.  Skin: Skin is warm and dry. No rash noted. She is not diaphoretic. No erythema. No pallor.  Psychiatric: She has a normal mood and affect. Her behavior is normal.  Nursing note and vitals reviewed.   ED Course  Procedures (including critical care time)  Labs Review Labs Reviewed  COMPREHENSIVE METABOLIC PANEL - Abnormal; Notable for the following:    Glucose, Bld 155 (*)    ALT 10 (*)    Anion gap 16 (*)    All other components within normal limits  CBC - Abnormal; Notable for the following:    WBC 11.9 (*)    All other components within normal limits  URINALYSIS, ROUTINE W REFLEX MICROSCOPIC (NOT AT [facility]) - Abnormal; Notable for the following:    Hgb urine dipstick  SMALL (*)    Ketones, ur 40 (*)    All other components within normal limits  URINE MICROSCOPIC-ADD ON - Abnormal; Notable for the following:    Squamous Epithelial / LPF 0-5 (*)    Bacteria, UA FEW (*)    All other components within normal limits  LIPASE, BLOOD  PREGNANCY, URINE    Imaging Review No results found. I have personally reviewed and evaluated these images and lab results as part of my medical decision-making.   EKG Interpretation None      MDM   Final diagnoses:  RUQ abdominal pain    45 year old female with no significant past medical history presents to the ED complaining of progressively worsening right upper quadrant abdominal pain over the last 2 weeks. Patient appears well ED, nontoxic, nonseptic. Afebrile and all vitals are stable. There is moderate tenderness on exam in her right upper quadrant. No Murphy sign appreciated. However given the location of pain that worsens after eating concern for gallbladder etiology. We'll obtain blood work and right upper quadrant ultrasound. Patient states she does not want any pain medication at this time.  Ultrasound normal. Gallbladder appears to be within normal limits. All blood work WNL. Discussed with patient option of CT her belly at this time as I cannot give her an answer for her belly pain. Her blood work appears to within normal limits. Doubt emergent cause of belly pain. Patient would like to follow up with her primary care doctor on Tuesday as she has a scheduled appointment. If she needs a CT scan she can do this as an outpatient.  Discussed case with Dr. Rubin PayorPickering who agrees with treatment plan.    Lester KinsmanSamantha Tripp [place] Heights[place], PA-C 03/31/15 1744  Benjiman CoreNathan Pickering, MD 03/31/15 2213

## 2015-03-31 NOTE — ED Notes (Signed)
Pt c/o abdominal pain onset x 2 weeks. Pt denies N/V. Reports she has an appointment with her PMD on Tuesday.

## 2015-03-31 NOTE — Discharge Instructions (Signed)
Abdominal Pain, Adult Many things can cause abdominal pain. Usually, abdominal pain is not caused by a disease and will improve without treatment. It can often be observed and treated at home. Your health care provider will do a physical exam and possibly order blood tests and X-rays to help determine the seriousness of your pain. However, in many cases, more time must pass before a clear cause of the pain can be found. Before that point, your health care provider may not know if you need more testing or further treatment. HOME CARE INSTRUCTIONS Monitor your abdominal pain for any changes. The following actions may help to alleviate any discomfort you are experiencing:  Only take over-the-counter or prescription medicines as directed by your health care provider.  Do not take laxatives unless directed to do so by your health care provider.  Try a clear liquid diet (broth, tea, or water) as directed by your health care provider. Slowly move to a bland diet as tolerated. SEEK MEDICAL CARE IF:  You have unexplained abdominal pain.  You have abdominal pain associated with nausea or diarrhea.  You have pain when you urinate or have a bowel movement.  You experience abdominal pain that wakes you in the night.  You have abdominal pain that is worsened or improved by eating food.  You have abdominal pain that is worsened with eating fatty foods.  You have a fever. SEEK IMMEDIATE MEDICAL CARE IF:  Your pain does not go away within 2 hours.  You keep throwing up (vomiting).  Your pain is felt only in portions of the abdomen, such as the right side or the left lower portion of the abdomen.  You pass bloody or black tarry stools. MAKE SURE YOU:  Understand these instructions.  Will watch your condition.  Will get help right away if you are not doing well or get worse.   This information is not intended to replace advice given to you by your health care provider. Make sure you discuss  any questions you have with your health care provider.    Keep scheduled appointment with your PCP for Tuesday. May take ibuprofen as needed for pain. Avoid fatty foods as this may irritate her stomach. Return to the emergency department if you experience severe increase in ear pain, fever, chills, sweating, vomiting, blood in your stool, burning with urination.

## 2015-03-31 NOTE — ED Notes (Signed)
Patient transported to Ultrasound 

## 2015-04-16 ENCOUNTER — Encounter: Payer: Self-pay | Admitting: Internal Medicine

## 2015-04-23 ENCOUNTER — Other Ambulatory Visit (HOSPITAL_COMMUNITY): Payer: Self-pay | Admitting: Family Medicine

## 2015-04-23 DIAGNOSIS — R1011 Right upper quadrant pain: Secondary | ICD-10-CM

## 2015-04-30 ENCOUNTER — Ambulatory Visit (HOSPITAL_COMMUNITY)
Admission: RE | Admit: 2015-04-30 | Discharge: 2015-04-30 | Disposition: A | Discharge status: Home / Self Care | Payer: Managed Care, Other (non HMO) | Source: Ambulatory Visit | Attending: Family Medicine | Admitting: Family Medicine

## 2015-04-30 ENCOUNTER — Encounter (HOSPITAL_COMMUNITY): Payer: Self-pay

## 2015-04-30 DIAGNOSIS — R1011 Right upper quadrant pain: Secondary | ICD-10-CM | POA: Diagnosis present

## 2015-04-30 MED ORDER — TECHNETIUM TC 99M MEBROFENIN IV KIT
5.0000 | PACK | Freq: Once | INTRAVENOUS | Status: AC | PRN
  Administered 2015-04-30: 5.4 via INTRAVENOUS

## 2015-05-02 ENCOUNTER — Other Ambulatory Visit: Payer: Self-pay | Admitting: Gastroenterology

## 2015-05-02 DIAGNOSIS — R142 Eructation: Secondary | ICD-10-CM

## 2015-05-02 DIAGNOSIS — R634 Abnormal weight loss: Secondary | ICD-10-CM

## 2015-05-02 DIAGNOSIS — R1013 Epigastric pain: Secondary | ICD-10-CM

## 2015-05-07 ENCOUNTER — Ambulatory Visit: Payer: Self-pay | Admitting: Nurse Practitioner

## 2015-05-07 ENCOUNTER — Ambulatory Visit
Admission: RE | Admit: 2015-05-07 | Discharge: 2015-05-07 | Disposition: A | Discharge status: Home / Self Care | Payer: Managed Care, Other (non HMO) | Source: Ambulatory Visit | Attending: Gastroenterology | Admitting: Gastroenterology

## 2015-05-07 DIAGNOSIS — R634 Abnormal weight loss: Secondary | ICD-10-CM

## 2015-05-07 DIAGNOSIS — R1013 Epigastric pain: Secondary | ICD-10-CM

## 2015-05-07 DIAGNOSIS — R142 Eructation: Secondary | ICD-10-CM

## 2016-01-22 ENCOUNTER — Other Ambulatory Visit: Payer: Self-pay | Admitting: Obstetrics & Gynecology

## 2016-01-23 LAB — CYTOLOGY - PAP

## 2016-05-19 IMAGING — RF DG UGI W/ HIGH DENSITY W/KUB
19 of 24 series · 19 of 24 positions shown · non-contrast
Comparison: CT abdomen and pelvis of 05/12/2002

CLINICAL DATA: Epigastric pain, belching, weight loss

EXAM:
UPPER GI SERIES WITH KUB
TECHNIQUE: After obtaining a scout radiograph a routine upper GI series was
performed using thin and high density barium.
FLUOROSCOPY TIME:  Radiation Exposure Index (as provided by the
fluoroscopic device): 129 deciGy per square cm
If the device does not provide the exposure index:
Fluoroscopy Time (in minutes and seconds):  2 minutes 30 seconds
Number of Acquired Images:

[Series 3: run · 1 of 1 slices shown (1 of 19)]
[im 1/1]
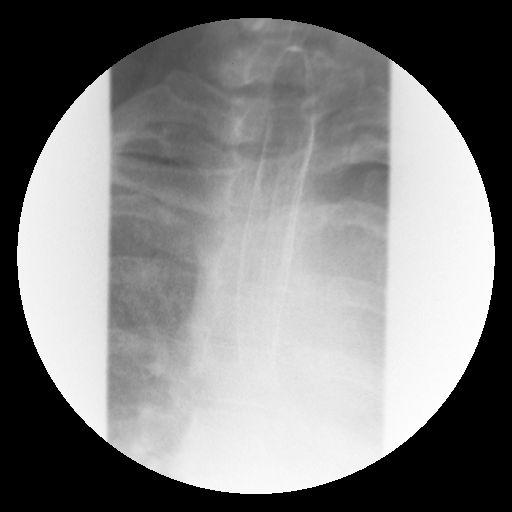

[Series 4: run · 1 of 1 slices shown (2 of 19)]
[im 1/1]
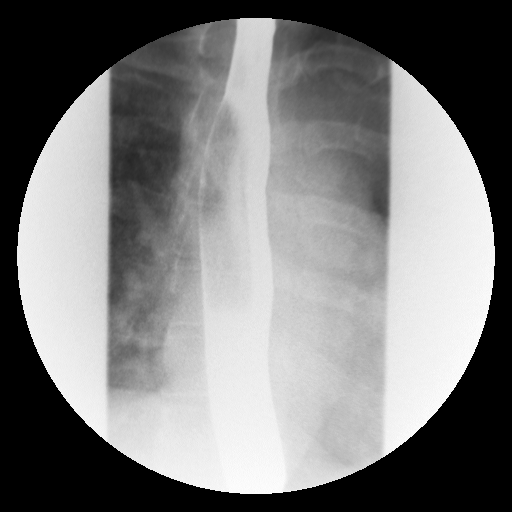

[Series 6: run · 1 of 1 slices shown (3 of 19)]
[im 1/1]
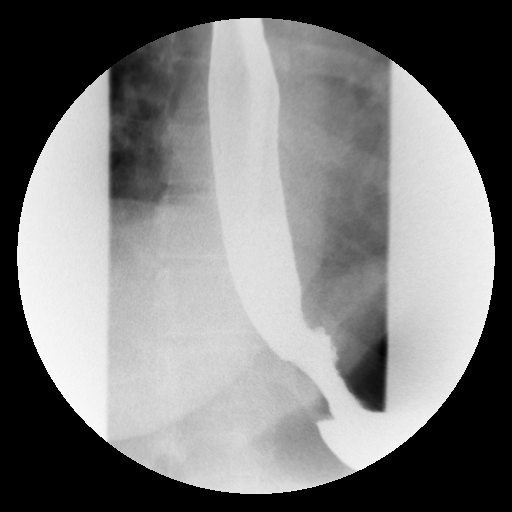

[Series 7: run · 1 of 1 slices shown (4 of 19)]
[im 1/1]
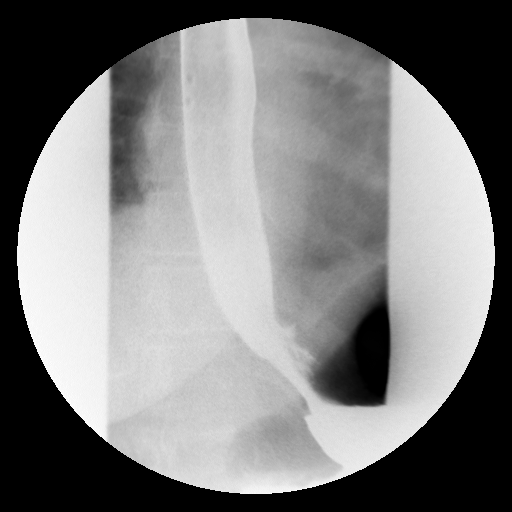

[Series 8: run · 1 of 1 slices shown (5 of 19)]
[im 1/1]
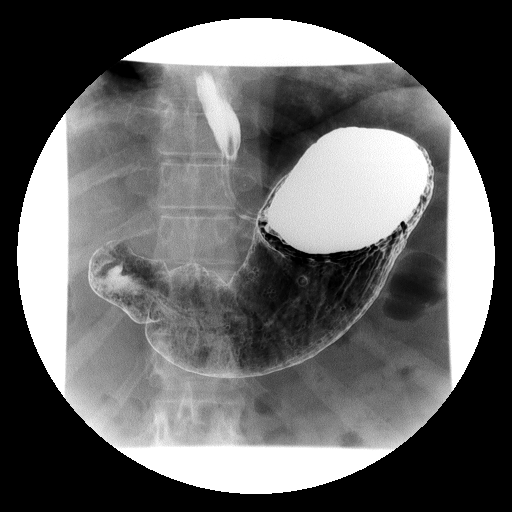

[Series 9: run · 1 of 1 slices shown (6 of 19)]
[im 1/1]
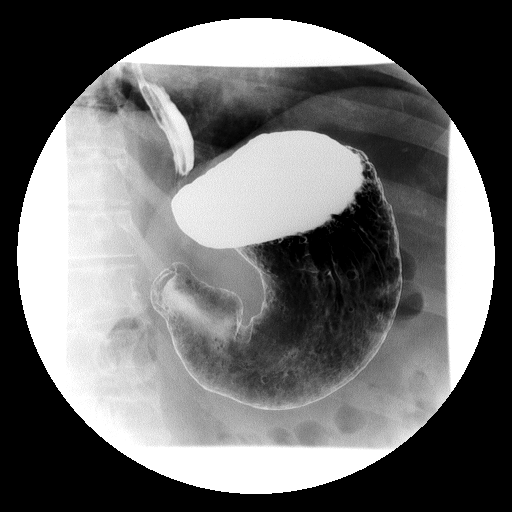

[Series 11: run · 1 of 1 slices shown (7 of 19)]
[im 1/1]
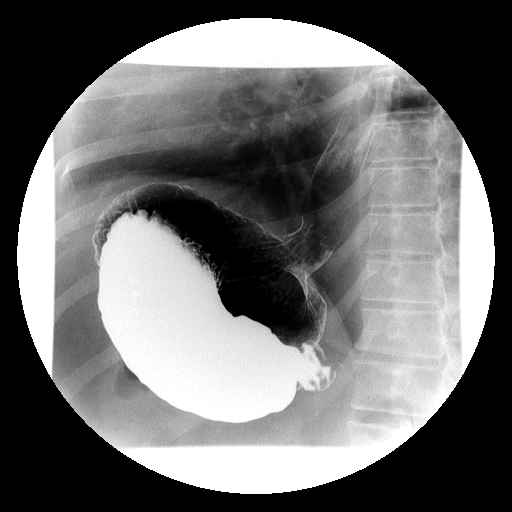

[Series 12: run · 1 of 1 slices shown (8 of 19)]
[im 1/1]
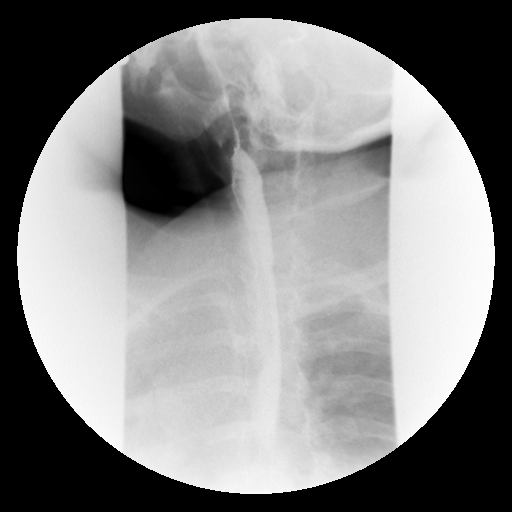

[Series 13: run · 1 of 1 slices shown (9 of 19)]
[im 1/1]
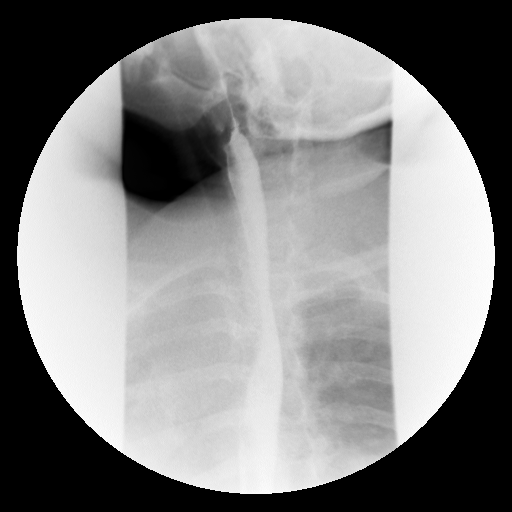

[Series 15: run · 1 of 1 slices shown (10 of 19)]
[im 1/1]
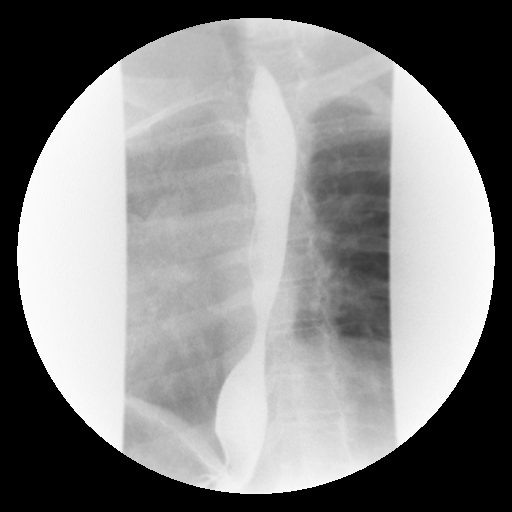

[Series 16: run · 1 of 1 slices shown (11 of 19)]
[im 1/1]
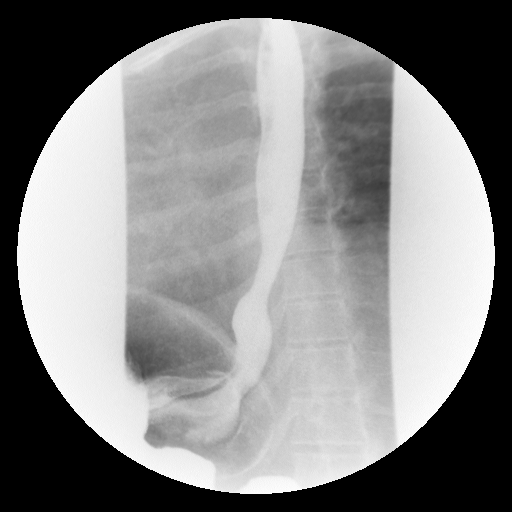

[Series 17: run · 1 of 1 slices shown (12 of 19)]
[im 1/1]
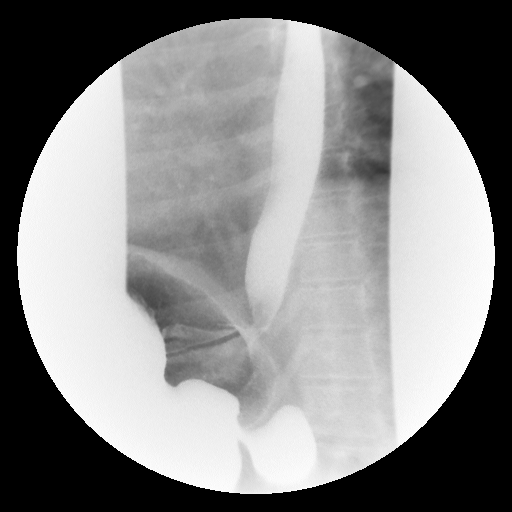

[Series 18: run · 1 of 1 slices shown (13 of 19)]
[im 1/1]
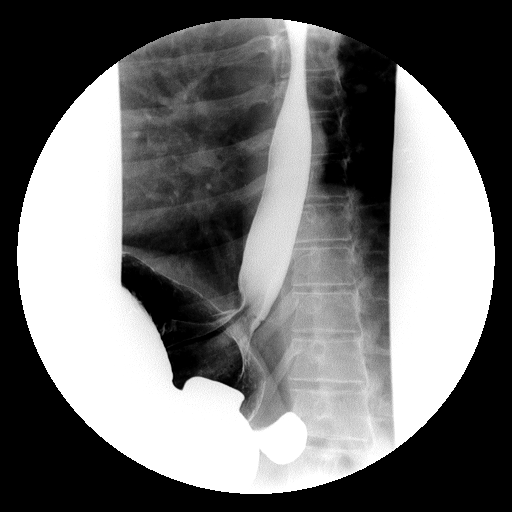

[Series 20: run · 1 of 1 slices shown (14 of 19)]
[im 1/1]
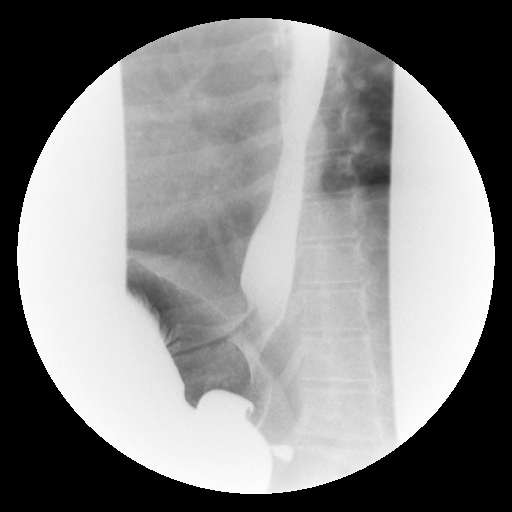

[Series 21: run · 1 of 1 slices shown (15 of 19)]
[im 1/1]
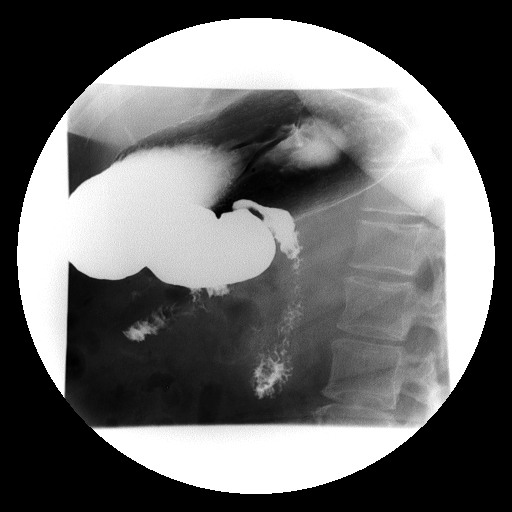

[Series 22: run · 1 of 1 slices shown (16 of 19)]
[im 1/1]
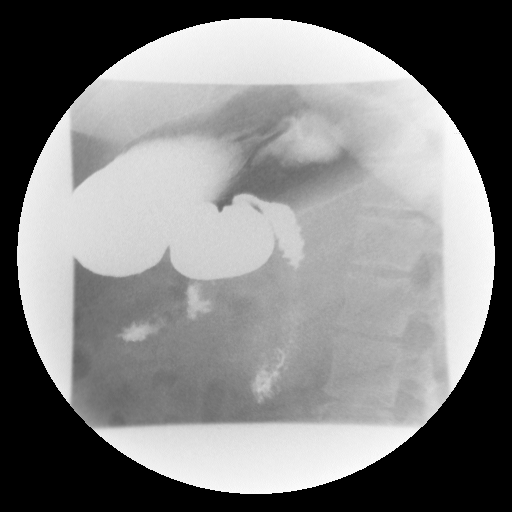

[Series 23: run · 1 of 1 slices shown (17 of 19)]
[im 1/1]
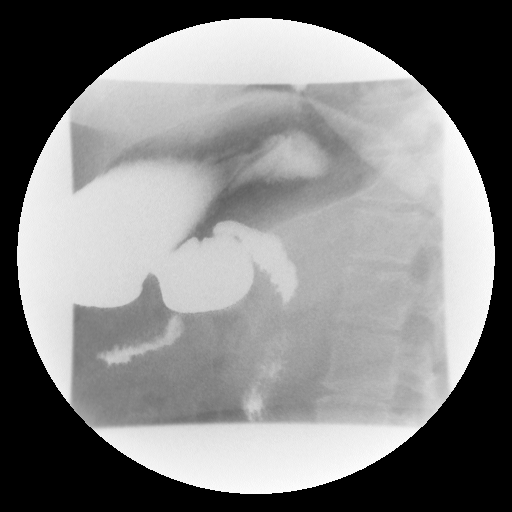

[Series 25: run · 1 of 1 slices shown (18 of 19)]
[im 1/1]
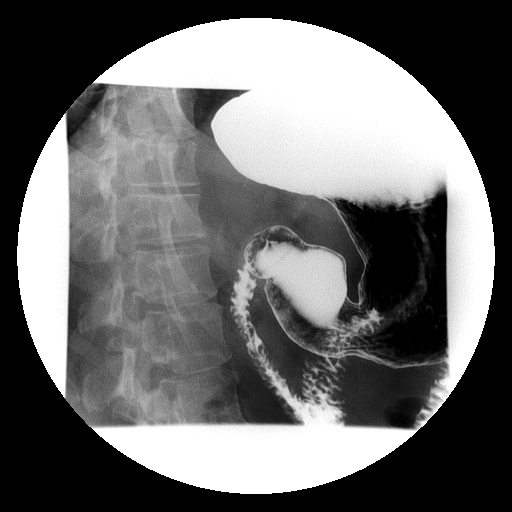

[Series 26: run · 1 of 1 slices shown (19 of 19)]
[im 1/1]
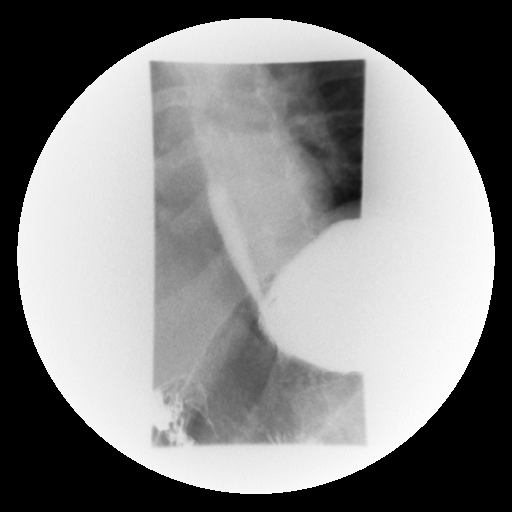

[19 of 24 positions shown; findings below may reference images not displayed]

FINDINGS: A preliminary film of the abdomen shows a nonspecific bowel gas
pattern. No opaque calculi are noted. No bony abnormality is seen.

A double-contrast upper GI was performed. The mucosa of the
esophagus is unremarkable. A single contrast study shows the
swallowing mechanism to be normal. Esophageal peristalsis is normal.
Only a tiny hiatal hernia is noted. However there is mild to
moderate gastroesophageal reflux demonstrated at the end of the
study. A barium pill was given which did pass into the stomach
without delay.

The stomach is normal in contour and peristalsis. The duodenal bulb
fills and the duodenal loop is in normal position.
IMPRESSION: 1. Very small hiatal hernia with mild to moderate gastroesophageal
reflux. Barium pill passes into the stomach without delay.
2. Otherwise the stomach and duodenum are unremarkable.

## 2018-11-07 ENCOUNTER — Other Ambulatory Visit: Payer: Self-pay

## 2018-11-07 DIAGNOSIS — Z20822 Contact with and (suspected) exposure to covid-19: Secondary | ICD-10-CM

## 2018-11-09 LAB — NOVEL CORONAVIRUS, NAA: SARS-CoV-2, NAA: NOT DETECTED

## 2018-12-27 ENCOUNTER — Other Ambulatory Visit (HOSPITAL_COMMUNITY): Payer: Self-pay | Admitting: Family Medicine

## 2018-12-27 ENCOUNTER — Other Ambulatory Visit: Payer: Self-pay

## 2018-12-27 ENCOUNTER — Ambulatory Visit (HOSPITAL_COMMUNITY)
Admission: RE | Admit: 2018-12-27 | Discharge: 2018-12-27 | Disposition: A | Discharge status: Home / Self Care | Payer: Managed Care, Other (non HMO) | Source: Ambulatory Visit | Attending: Family Medicine | Admitting: Family Medicine

## 2018-12-27 DIAGNOSIS — M25531 Pain in right wrist: Secondary | ICD-10-CM

## 2019-08-03 DIAGNOSIS — E6609 Other obesity due to excess calories: Secondary | ICD-10-CM | POA: Diagnosis not present

## 2019-08-03 DIAGNOSIS — E7849 Other hyperlipidemia: Secondary | ICD-10-CM | POA: Diagnosis not present

## 2019-08-03 DIAGNOSIS — Z1389 Encounter for screening for other disorder: Secondary | ICD-10-CM | POA: Diagnosis not present

## 2019-08-03 DIAGNOSIS — H9113 Presbycusis, bilateral: Secondary | ICD-10-CM | POA: Diagnosis not present

## 2019-08-03 DIAGNOSIS — Z6833 Body mass index (BMI) 33.0-33.9, adult: Secondary | ICD-10-CM | POA: Diagnosis not present

## 2019-09-04 DIAGNOSIS — F329 Major depressive disorder, single episode, unspecified: Secondary | ICD-10-CM | POA: Diagnosis not present

## 2019-10-03 DIAGNOSIS — L723 Sebaceous cyst: Secondary | ICD-10-CM | POA: Diagnosis not present

## 2019-10-03 DIAGNOSIS — E6609 Other obesity due to excess calories: Secondary | ICD-10-CM | POA: Diagnosis not present

## 2019-10-03 DIAGNOSIS — Z6834 Body mass index (BMI) 34.0-34.9, adult: Secondary | ICD-10-CM | POA: Diagnosis not present

## 2019-10-17 DIAGNOSIS — L280 Lichen simplex chronicus: Secondary | ICD-10-CM | POA: Diagnosis not present

## 2019-10-25 DIAGNOSIS — D225 Melanocytic nevi of trunk: Secondary | ICD-10-CM | POA: Diagnosis not present

## 2019-10-25 DIAGNOSIS — Z1283 Encounter for screening for malignant neoplasm of skin: Secondary | ICD-10-CM | POA: Diagnosis not present

## 2020-02-16 DIAGNOSIS — Z03818 Encounter for observation for suspected exposure to other biological agents ruled out: Secondary | ICD-10-CM | POA: Diagnosis not present

## 2020-02-25 DIAGNOSIS — Z20822 Contact with and (suspected) exposure to covid-19: Secondary | ICD-10-CM | POA: Diagnosis not present

## 2020-02-28 ENCOUNTER — Ambulatory Visit: Payer: Self-pay | Admitting: Dermatology

## 2020-03-28 DIAGNOSIS — M25551 Pain in right hip: Secondary | ICD-10-CM | POA: Diagnosis not present

## 2020-03-28 DIAGNOSIS — M5116 Intervertebral disc disorders with radiculopathy, lumbar region: Secondary | ICD-10-CM | POA: Diagnosis not present

## 2020-03-28 DIAGNOSIS — M9905 Segmental and somatic dysfunction of pelvic region: Secondary | ICD-10-CM | POA: Diagnosis not present

## 2020-03-28 DIAGNOSIS — M9903 Segmental and somatic dysfunction of lumbar region: Secondary | ICD-10-CM | POA: Diagnosis not present

## 2020-04-02 DIAGNOSIS — M5116 Intervertebral disc disorders with radiculopathy, lumbar region: Secondary | ICD-10-CM | POA: Diagnosis not present

## 2020-04-02 DIAGNOSIS — M25551 Pain in right hip: Secondary | ICD-10-CM | POA: Diagnosis not present

## 2020-04-02 DIAGNOSIS — M9903 Segmental and somatic dysfunction of lumbar region: Secondary | ICD-10-CM | POA: Diagnosis not present

## 2020-04-02 DIAGNOSIS — M9905 Segmental and somatic dysfunction of pelvic region: Secondary | ICD-10-CM | POA: Diagnosis not present

## 2020-04-04 DIAGNOSIS — M5116 Intervertebral disc disorders with radiculopathy, lumbar region: Secondary | ICD-10-CM | POA: Diagnosis not present

## 2020-04-04 DIAGNOSIS — M25551 Pain in right hip: Secondary | ICD-10-CM | POA: Diagnosis not present

## 2020-04-04 DIAGNOSIS — M9905 Segmental and somatic dysfunction of pelvic region: Secondary | ICD-10-CM | POA: Diagnosis not present

## 2020-04-04 DIAGNOSIS — M9903 Segmental and somatic dysfunction of lumbar region: Secondary | ICD-10-CM | POA: Diagnosis not present

## 2020-04-08 DIAGNOSIS — M5116 Intervertebral disc disorders with radiculopathy, lumbar region: Secondary | ICD-10-CM | POA: Diagnosis not present

## 2020-04-08 DIAGNOSIS — M9903 Segmental and somatic dysfunction of lumbar region: Secondary | ICD-10-CM | POA: Diagnosis not present

## 2020-04-08 DIAGNOSIS — M9905 Segmental and somatic dysfunction of pelvic region: Secondary | ICD-10-CM | POA: Diagnosis not present

## 2020-04-08 DIAGNOSIS — M25551 Pain in right hip: Secondary | ICD-10-CM | POA: Diagnosis not present

## 2020-04-09 DIAGNOSIS — M9905 Segmental and somatic dysfunction of pelvic region: Secondary | ICD-10-CM | POA: Diagnosis not present

## 2020-04-09 DIAGNOSIS — M9903 Segmental and somatic dysfunction of lumbar region: Secondary | ICD-10-CM | POA: Diagnosis not present

## 2020-04-09 DIAGNOSIS — M25551 Pain in right hip: Secondary | ICD-10-CM | POA: Diagnosis not present

## 2020-04-09 DIAGNOSIS — M5116 Intervertebral disc disorders with radiculopathy, lumbar region: Secondary | ICD-10-CM | POA: Diagnosis not present

## 2020-04-10 DIAGNOSIS — M9903 Segmental and somatic dysfunction of lumbar region: Secondary | ICD-10-CM | POA: Diagnosis not present

## 2020-04-10 DIAGNOSIS — M9905 Segmental and somatic dysfunction of pelvic region: Secondary | ICD-10-CM | POA: Diagnosis not present

## 2020-04-10 DIAGNOSIS — M25551 Pain in right hip: Secondary | ICD-10-CM | POA: Diagnosis not present

## 2020-04-10 DIAGNOSIS — M5116 Intervertebral disc disorders with radiculopathy, lumbar region: Secondary | ICD-10-CM | POA: Diagnosis not present

## 2020-04-11 DIAGNOSIS — M5116 Intervertebral disc disorders with radiculopathy, lumbar region: Secondary | ICD-10-CM | POA: Diagnosis not present

## 2020-04-11 DIAGNOSIS — M9903 Segmental and somatic dysfunction of lumbar region: Secondary | ICD-10-CM | POA: Diagnosis not present

## 2020-04-11 DIAGNOSIS — M25551 Pain in right hip: Secondary | ICD-10-CM | POA: Diagnosis not present

## 2020-04-11 DIAGNOSIS — M9905 Segmental and somatic dysfunction of pelvic region: Secondary | ICD-10-CM | POA: Diagnosis not present

## 2020-04-15 DIAGNOSIS — M5116 Intervertebral disc disorders with radiculopathy, lumbar region: Secondary | ICD-10-CM | POA: Diagnosis not present

## 2020-04-15 DIAGNOSIS — M9905 Segmental and somatic dysfunction of pelvic region: Secondary | ICD-10-CM | POA: Diagnosis not present

## 2020-04-15 DIAGNOSIS — M9903 Segmental and somatic dysfunction of lumbar region: Secondary | ICD-10-CM | POA: Diagnosis not present

## 2020-04-15 DIAGNOSIS — M25551 Pain in right hip: Secondary | ICD-10-CM | POA: Diagnosis not present

## 2020-04-17 DIAGNOSIS — M25551 Pain in right hip: Secondary | ICD-10-CM | POA: Diagnosis not present

## 2020-04-17 DIAGNOSIS — M9905 Segmental and somatic dysfunction of pelvic region: Secondary | ICD-10-CM | POA: Diagnosis not present

## 2020-04-17 DIAGNOSIS — M5116 Intervertebral disc disorders with radiculopathy, lumbar region: Secondary | ICD-10-CM | POA: Diagnosis not present

## 2020-04-17 DIAGNOSIS — M9903 Segmental and somatic dysfunction of lumbar region: Secondary | ICD-10-CM | POA: Diagnosis not present

## 2020-04-18 DIAGNOSIS — M9905 Segmental and somatic dysfunction of pelvic region: Secondary | ICD-10-CM | POA: Diagnosis not present

## 2020-04-18 DIAGNOSIS — M25551 Pain in right hip: Secondary | ICD-10-CM | POA: Diagnosis not present

## 2020-04-18 DIAGNOSIS — M5116 Intervertebral disc disorders with radiculopathy, lumbar region: Secondary | ICD-10-CM | POA: Diagnosis not present

## 2020-04-18 DIAGNOSIS — M9903 Segmental and somatic dysfunction of lumbar region: Secondary | ICD-10-CM | POA: Diagnosis not present

## 2020-04-22 DIAGNOSIS — M5116 Intervertebral disc disorders with radiculopathy, lumbar region: Secondary | ICD-10-CM | POA: Diagnosis not present

## 2020-04-22 DIAGNOSIS — M9905 Segmental and somatic dysfunction of pelvic region: Secondary | ICD-10-CM | POA: Diagnosis not present

## 2020-04-22 DIAGNOSIS — M25551 Pain in right hip: Secondary | ICD-10-CM | POA: Diagnosis not present

## 2020-04-22 DIAGNOSIS — M9903 Segmental and somatic dysfunction of lumbar region: Secondary | ICD-10-CM | POA: Diagnosis not present

## 2020-04-23 DIAGNOSIS — M25551 Pain in right hip: Secondary | ICD-10-CM | POA: Diagnosis not present

## 2020-04-23 DIAGNOSIS — M9905 Segmental and somatic dysfunction of pelvic region: Secondary | ICD-10-CM | POA: Diagnosis not present

## 2020-04-23 DIAGNOSIS — M5116 Intervertebral disc disorders with radiculopathy, lumbar region: Secondary | ICD-10-CM | POA: Diagnosis not present

## 2020-04-23 DIAGNOSIS — M9903 Segmental and somatic dysfunction of lumbar region: Secondary | ICD-10-CM | POA: Diagnosis not present

## 2020-04-25 DIAGNOSIS — M9903 Segmental and somatic dysfunction of lumbar region: Secondary | ICD-10-CM | POA: Diagnosis not present

## 2020-04-25 DIAGNOSIS — M9905 Segmental and somatic dysfunction of pelvic region: Secondary | ICD-10-CM | POA: Diagnosis not present

## 2020-04-25 DIAGNOSIS — M25551 Pain in right hip: Secondary | ICD-10-CM | POA: Diagnosis not present

## 2020-04-25 DIAGNOSIS — M5116 Intervertebral disc disorders with radiculopathy, lumbar region: Secondary | ICD-10-CM | POA: Diagnosis not present

## 2020-04-29 DIAGNOSIS — M25551 Pain in right hip: Secondary | ICD-10-CM | POA: Diagnosis not present

## 2020-04-29 DIAGNOSIS — M9905 Segmental and somatic dysfunction of pelvic region: Secondary | ICD-10-CM | POA: Diagnosis not present

## 2020-04-29 DIAGNOSIS — M9903 Segmental and somatic dysfunction of lumbar region: Secondary | ICD-10-CM | POA: Diagnosis not present

## 2020-04-29 DIAGNOSIS — M5116 Intervertebral disc disorders with radiculopathy, lumbar region: Secondary | ICD-10-CM | POA: Diagnosis not present

## 2020-05-02 DIAGNOSIS — M9905 Segmental and somatic dysfunction of pelvic region: Secondary | ICD-10-CM | POA: Diagnosis not present

## 2020-05-02 DIAGNOSIS — M5116 Intervertebral disc disorders with radiculopathy, lumbar region: Secondary | ICD-10-CM | POA: Diagnosis not present

## 2020-05-02 DIAGNOSIS — M25551 Pain in right hip: Secondary | ICD-10-CM | POA: Diagnosis not present

## 2020-05-02 DIAGNOSIS — M9903 Segmental and somatic dysfunction of lumbar region: Secondary | ICD-10-CM | POA: Diagnosis not present

## 2020-05-07 DIAGNOSIS — M5116 Intervertebral disc disorders with radiculopathy, lumbar region: Secondary | ICD-10-CM | POA: Diagnosis not present

## 2020-05-07 DIAGNOSIS — M9903 Segmental and somatic dysfunction of lumbar region: Secondary | ICD-10-CM | POA: Diagnosis not present

## 2020-05-07 DIAGNOSIS — M9905 Segmental and somatic dysfunction of pelvic region: Secondary | ICD-10-CM | POA: Diagnosis not present

## 2020-05-07 DIAGNOSIS — M25551 Pain in right hip: Secondary | ICD-10-CM | POA: Diagnosis not present

## 2020-05-09 DIAGNOSIS — Z124 Encounter for screening for malignant neoplasm of cervix: Secondary | ICD-10-CM | POA: Diagnosis not present

## 2020-05-09 DIAGNOSIS — N920 Excessive and frequent menstruation with regular cycle: Secondary | ICD-10-CM | POA: Diagnosis not present

## 2020-05-09 DIAGNOSIS — Z1231 Encounter for screening mammogram for malignant neoplasm of breast: Secondary | ICD-10-CM | POA: Diagnosis not present

## 2020-05-09 DIAGNOSIS — Z01419 Encounter for gynecological examination (general) (routine) without abnormal findings: Secondary | ICD-10-CM | POA: Diagnosis not present

## 2020-05-09 DIAGNOSIS — N926 Irregular menstruation, unspecified: Secondary | ICD-10-CM | POA: Diagnosis not present

## 2020-05-15 DIAGNOSIS — R030 Elevated blood-pressure reading, without diagnosis of hypertension: Secondary | ICD-10-CM | POA: Diagnosis not present

## 2020-05-15 DIAGNOSIS — H9193 Unspecified hearing loss, bilateral: Secondary | ICD-10-CM | POA: Diagnosis not present

## 2020-05-15 DIAGNOSIS — M5116 Intervertebral disc disorders with radiculopathy, lumbar region: Secondary | ICD-10-CM | POA: Diagnosis not present

## 2020-05-15 DIAGNOSIS — F329 Major depressive disorder, single episode, unspecified: Secondary | ICD-10-CM | POA: Diagnosis not present

## 2020-05-15 DIAGNOSIS — Z0001 Encounter for general adult medical examination with abnormal findings: Secondary | ICD-10-CM | POA: Diagnosis not present

## 2020-05-15 DIAGNOSIS — R7309 Other abnormal glucose: Secondary | ICD-10-CM | POA: Diagnosis not present

## 2020-05-15 DIAGNOSIS — Z6835 Body mass index (BMI) 35.0-35.9, adult: Secondary | ICD-10-CM | POA: Diagnosis not present

## 2020-05-15 DIAGNOSIS — M25551 Pain in right hip: Secondary | ICD-10-CM | POA: Diagnosis not present

## 2020-05-15 DIAGNOSIS — M9905 Segmental and somatic dysfunction of pelvic region: Secondary | ICD-10-CM | POA: Diagnosis not present

## 2020-05-15 DIAGNOSIS — R000 Tachycardia, unspecified: Secondary | ICD-10-CM | POA: Diagnosis not present

## 2020-05-15 DIAGNOSIS — J302 Other seasonal allergic rhinitis: Secondary | ICD-10-CM | POA: Diagnosis not present

## 2020-05-15 DIAGNOSIS — M9903 Segmental and somatic dysfunction of lumbar region: Secondary | ICD-10-CM | POA: Diagnosis not present

## 2020-05-15 DIAGNOSIS — E7849 Other hyperlipidemia: Secondary | ICD-10-CM | POA: Diagnosis not present

## 2020-05-15 DIAGNOSIS — Z1389 Encounter for screening for other disorder: Secondary | ICD-10-CM | POA: Diagnosis not present

## 2020-09-12 ENCOUNTER — Ambulatory Visit: Payer: Self-pay | Admitting: Cardiology

## 2020-12-04 ENCOUNTER — Other Ambulatory Visit: Payer: Self-pay

## 2020-12-04 ENCOUNTER — Other Ambulatory Visit (HOSPITAL_COMMUNITY): Payer: Self-pay | Admitting: Family Medicine

## 2020-12-04 ENCOUNTER — Ambulatory Visit (HOSPITAL_COMMUNITY)
Admission: RE | Admit: 2020-12-04 | Discharge: 2020-12-04 | Disposition: A | Discharge status: Home / Self Care | Payer: BC Managed Care – PPO | Source: Ambulatory Visit | Attending: Family Medicine | Admitting: Family Medicine

## 2020-12-04 DIAGNOSIS — M25571 Pain in right ankle and joints of right foot: Secondary | ICD-10-CM

## 2021-01-05 NOTE — Progress Notes (Signed)
Cardiology Office Note:    Date:  01/10/2021   ID:  MYLI PAE, DOB 08/03/1970, MRN [id number]  PCP:  Assunta Found, MD  Cardiologist:  Little Ishikawa, MD  Electrophysiologist:  None   Referring MD: Assunta Found, MD   Chief Complaint  Patient presents with   Chest Pain    History of Present Illness:    Nicole Wolfe is a 50 y.o. female with a hx of asthma, hyperlipidemia who is referred by Dr. Phillips Odor for evaluation of chest pain.  She reports he has been having 2 types of chest pain.  First is chest pain that feels like pressure behind sternum, was associated with eating food.  Improved with starting omeprazole.  Also with chest pain that she describes as sharp pain, lasting less than 1 minute.  No clear relationship with exertion though has not been exercising.  Does report that she gets short of breath with minimal exertion.  States that walking up 1 flight of stairs causes her to be short of breath.  She denies any lightheadedness, syncope, lower extremity edema.  Does report has been having palpitations where she feels like her heart is fluttering, last about 1 minute.  Has been occurring 2-3 times per month.  No smoking history.  Family history includes father had MI in 74s and mother had MI in 46s   Past Medical History:  Diagnosis Date   Anxiety    Asthma    Hard of hearing    Hearing loss    congenital nerve damage, familial trait   High cholesterol     Past Surgical History:  Procedure Laterality Date   BREAST SURGERY      Current Medications: Current Meds  Medication Sig   buPROPion (WELLBUTRIN XL) 150 MG 24 hr tablet Take 150 mg by mouth daily.   Loratadine (CLARITIN PO) Take by mouth.   metoprolol tartrate (LOPRESSOR) 100 MG tablet Take 1 tablet (100 mg total) by mouth once for 1 dose. PLEASE TAKE METOPROLOL 2  HOURS PRIOR TO CTA SCAN.   omeprazole (PRILOSEC) 20 MG capsule Take 20 mg by mouth daily.   paragard intrauterine copper IUD IUD  ParaGard T 380A 380 square mm intrauterine device  Take 1 device by intrauterine route.     Allergies:   Ibuprofen, Keflex [cephalexin], Macrobid [nitrofurantoin], Penicillins, and Sulfa antibiotics   Social History   Socioeconomic History   Marital status: Single    Spouse name: Not on file   Number of children: Not on file   Years of education: Not on file   Highest education level: Not on file  Occupational History   Not on file  Tobacco Use   Smoking status: Never   Smokeless tobacco: Never  Substance and Sexual Activity   Alcohol use: No    Alcohol/week: 0.0 standard drinks   Drug use: No   Sexual activity: Not on file  Other Topics Concern   Not on file  Social History Narrative   Not on file   Social Determinants of Health   Financial Resource Strain: Not on file  Food Insecurity: Not on file  Transportation Needs: Not on file  Physical Activity: Not on file  Stress: Not on file  Social Connections: Not on file     Family History: Family history includes father had MI in 72s and mother had MI in 17s  ROS:   Please see the history of present illness.     All other systems reviewed  and are negative.  EKGs/Labs/Other Studies Reviewed:    The following studies were reviewed today:   EKG:  EKG is  ordered today.  The ekg ordered today demonstrates normal sinus rhythm, rate 93, no ST abnormality  Recent Labs: 01/08/2021: BUN 10; Creatinine, Ser 0.74; Potassium 4.8; Sodium 140  Recent Lipid Panel    Component Value Date/Time   CHOL 205 (H) 01/08/2021 1022   TRIG 126 01/08/2021 1022   HDL 44 01/08/2021 1022   CHOLHDL 4.7 (H) 01/08/2021 1022   LDLCALC 138 (H) 01/08/2021 1022    Physical Exam:    VS:  BP 136/85 (BP Location: Left Arm, Patient Position: Sitting, Cuff Size: Normal)    Pulse 93    Ht 5' (1.524 m)    Wt 191 lb 12.8 oz (87 kg)    SpO2 100%    BMI 37.46 kg/m     Wt Readings from Last 3 Encounters:  01/08/21 191 lb 12.8 oz (87 kg)   03/31/15 180 lb 4 oz (81.8 kg)  06/13/14 183 lb (83 kg)     GEN:  Well nourished, well developed in no acute distress HEENT: Normal NECK: No JVD; No carotid bruits LYMPHATICS: No lymphadenopathy CARDIAC: RRR, no murmurs, rubs, gallops RESPIRATORY:  Clear to auscultation without rales, wheezing or rhonchi  ABDOMEN: Soft, non-tender, non-distended MUSCULOSKELETAL:  No edema; No deformity  SKIN: Warm and dry NEUROLOGIC:  Alert and oriented x 3 PSYCHIATRIC:  Normal affect   ASSESSMENT:    1. Precordial pain   2. Palpitations   3. Hyperlipidemia, unspecified hyperlipidemia type    PLAN:    Chest pain/DOE: Chest pain is atypical in description but does have CAD risk factors (hyperlipidemia, family history, age).  Also having dyspnea with minimal exertion, which could represent anginal equivalent. -Recommend coronary CTA to rule out obstructive CAD.  We will give Lopressor 100 mg prior to study -Echocardiogram  Palpitations: Description concerning for arrhythmia, will evaluate with Zio patch x2 weeks  Hyperlipidemia: LDL 144 on 05/15/2020.  We will recheck lipid panel.  We will follow-up results of coronary CTA to guide how aggressive to be in lowering cholesterol  RTC as needed.  She is moving to [place], [place] at the end of the year.  If abnormal results on cardiac testing, will recommend establishing with cardiologist in [facility]  Medication Adjustments/Labs and Tests Ordered: Current medicines are reviewed at length with the patient today.  Concerns regarding medicines are outlined above.  Orders Placed This Encounter  Procedures   CT CORONARY MORPH W/CTA COR W/SCORE W/CA W/CM &/OR WO/CM   Lipid panel   Basic metabolic panel   LONG TERM MONITOR (3-14 DAYS)   EKG 12-Lead   ECHOCARDIOGRAM COMPLETE   Meds ordered this encounter  Medications   metoprolol tartrate (LOPRESSOR) 100 MG tablet    Sig: Take 1 tablet (100 mg total) by mouth once for 1 dose. PLEASE TAKE METOPROLOL  2  HOURS PRIOR TO CTA SCAN.    Dispense:  1 tablet    Refill:  0    Patient Instructions  Medication Instructions:  PLEASE TAKE  METOPROLOL TARTRATE 2 HOURS PRIOR TO CCTA SCAN  *If you need a refill on your cardiac medications before your next appointment, please call your pharmacy*  Lab Work: BMET AND LIPID PANEL TODAY  If you have labs (blood work) drawn today and your tests are completely normal, you will receive your results only by: MyChart Message (if you have MyChart) OR A paper copy  in the mail If you have any lab test that is abnormal or we need to change your treatment, we will call you to review the results.  Testing/Procedures: Your physician has requested that you have an echocardiogram. Echocardiography is a painless test that uses sound waves to create images of your heart. It provides your doctor with information about the size and shape of your heart and how well your hearts chambers and valves are working. You may receive an ultrasound enhancing agent through an IV if needed to better visualize your heart during the echo.This procedure takes approximately one hour. There are no restrictions for this procedure. This will take place at the [address], [address].    ZIO XT- Long Term Monitor Instructions   Your physician has requested you wear your ZIO patch monitor 14 days.   This is a single patch monitor.  [employer] supplies one patch monitor per enrollment.  Additional stickers are not available.   Please do not apply patch if you will be having a Nuclear Stress Test, Echocardiogram, Cardiac CT, MRI, or Chest Xray during the time frame you would be wearing the monitor. The patch cannot be worn during these tests.  You cannot remove and re-apply the ZIO XT patch monitor.   Your ZIO patch monitor will be sent USPS Priority mail from [facility] directly to your home address. The monitor may also be mailed to a PO BOX if home delivery is not available.    It may take 3-5 days to receive your monitor after you have been enrolled.   Once you have received you monitor, please review enclosed instructions.  Your monitor has already been registered assigning a specific monitor serial # to you.   Applying the monitor   Shave hair from upper left chest.   Hold abrader disc by orange tab.  Rub abrader in 40 strokes over left upper chest as indicated in your monitor instructions.   Clean area with 4 enclosed alcohol pads .  Use all pads to assure are is cleaned thoroughly.  Let dry.   Apply patch as indicated in monitor instructions.  Patch will be place under collarbone on left side of chest with arrow pointing upward.   Rub patch adhesive wings for 2 minutes.Remove white label marked "1".  Remove white label marked "2".  Rub patch adhesive wings for 2 additional minutes.   While looking in a mirror, press and release button in center of patch.  A small green light will flash 3-4 times .  This will be your only indicator the monitor has been turned on.     Do not shower for the first 24 hours.  You may shower after the first 24 hours.   Press button if you feel a symptom. You will hear a small click.  Record Date, Time and Symptom in the Patient Log Book.   When you are ready to remove patch, follow instructions on last 2 pages of Patient Log Book.  Stick patch monitor onto last page of Patient Log Book.   Place Patient Log Book in [place] box.  Use locking tab on box and tape box closed securely.  The [employer] and [employer] has [employer] on it.  Please place in mailbox as soon as possible.  Your physician should have your test results approximately 7 days after the monitor has been mailed back to [facility].   Call [facility] Customer Care at [phone] if you have questions regarding  your ZIO XT patch monitor.  Call them immediately if you see an orange light blinking on your monitor.   If your monitor falls off in less than 4 days  contact our Monitor department at [phone].  If your monitor becomes loose or falls off after 4 days call [employer] at [phone] for suggestions on securing your monitor.    Follow-Up: At [facility], you and your health needs are our priority.  As part of our continuing mission to provide you with exceptional heart care, we have created designated Provider Care Teams.  These Care Teams include your primary Cardiologist (physician) and Advanced Practice Providers (APPs -  Physician Assistants and Nurse Practitioners) who all work together to provide you with the care you need, when you need it.  Your next appointment:   AS NEEDED   The format for your next appointment:   In Person  Provider:   Little Ishikawa, MD    Other Instructions   Your cardiac CT will be scheduled at one of the below locations:   [facility] [address] [place], [place] [address] [phone]  If scheduled at [facility], please arrive at the [facility] main entrance (entrance A) of [facility] 30 minutes prior to test start time. You can use the FREE valet parking offered at the main entrance (encouraged to control the heart rate for the test) Proceed to the [facility] Radiology Department (first floor) to check-in and test prep.  Please follow these instructions carefully (unless otherwise directed):  On the Night Before the Test: Be sure to Drink plenty of water. Do not consume any caffeinated/decaffeinated beverages or chocolate 12 hours prior to your test. Do not take any antihistamines 12 hours prior to your test.  On the Day of the Test: Drink plenty of water until 1 hour prior to the test. Do not eat any food 4 hours prior to the test. You may take your regular medications prior to the test.  Take metoprolol (Lopressor) 100mg  two hours prior to test. HOLD Furosemide/Hydrochlorothiazide morning of the test. FEMALES- please wear underwire-free bra  if available, avoid dresses & tight clothing      After the Test: Drink plenty of water. After receiving IV contrast, you may experience a mild flushed feeling. This is normal. On occasion, you may experience a mild rash up to 24 hours after the test. This is not dangerous. If this occurs, you can take Benadryl 25 mg and increase your fluid intake. If you experience trouble breathing, this can be serious. If it is severe call 911 IMMEDIATELY. If it is mild, please call our office. If you take any of these medications: Glipizide/Metformin, Avandament, Glucavance, please do not take 48 hours after completing test unless otherwise instructed.  Please allow 2-4 weeks for scheduling of routine cardiac CTs. Some insurance companies require a pre-authorization which may delay scheduling of this test.   For non-scheduling related questions, please contact the cardiac imaging nurse navigator should you have any questions/concerns: , Cardiac Imaging Nurse Navigator [place] [place], Cardiac Imaging Nurse Navigator [facility] Direct Office Dial: [phone]   For scheduling needs, including cancellations and rescheduling, please call [phone], [phone].     Signed, [phone], MD  01/10/2021 9:22 AM    [facility]

## 2021-01-08 ENCOUNTER — Encounter: Payer: Self-pay | Admitting: Cardiology

## 2021-01-08 ENCOUNTER — Ambulatory Visit (INDEPENDENT_AMBULATORY_CARE_PROVIDER_SITE_OTHER): Payer: BC Managed Care – PPO

## 2021-01-08 ENCOUNTER — Other Ambulatory Visit: Payer: Self-pay

## 2021-01-08 ENCOUNTER — Ambulatory Visit (INDEPENDENT_AMBULATORY_CARE_PROVIDER_SITE_OTHER): Payer: BC Managed Care – PPO | Admitting: Cardiology

## 2021-01-08 VITALS — BP 136/85 | HR 93 | Ht 60.0 in | Wt 191.8 lb

## 2021-01-08 DIAGNOSIS — R002 Palpitations: Secondary | ICD-10-CM

## 2021-01-08 DIAGNOSIS — R072 Precordial pain: Secondary | ICD-10-CM

## 2021-01-08 DIAGNOSIS — E785 Hyperlipidemia, unspecified: Secondary | ICD-10-CM

## 2021-01-08 LAB — LIPID PANEL
Chol/HDL Ratio: 4.7 ratio — ABNORMAL HIGH (ref 0.0–4.4)
Cholesterol, Total: 205 mg/dL — ABNORMAL HIGH (ref 100–199)
HDL: 44 mg/dL (ref >39)
LDL Chol Calc (NIH): 138 mg/dL — ABNORMAL HIGH (ref 0–99)
Triglycerides: 126 mg/dL (ref 0–149)
VLDL Cholesterol Cal: 23 mg/dL (ref 5–40)

## 2021-01-08 LAB — BASIC METABOLIC PANEL
BUN/Creatinine Ratio: 14 (ref 9–23)
BUN: 10 mg/dL (ref 6–24)
CO2: 26 mmol/L (ref 20–29)
Calcium: 9.5 mg/dL (ref 8.7–10.2)
Chloride: 101 mmol/L (ref 96–106)
Creatinine, Ser: 0.74 mg/dL (ref 0.57–1.00)
Glucose: 87 mg/dL (ref 70–99)
Potassium: 4.8 mmol/L (ref 3.5–5.2)
Sodium: 140 mmol/L (ref 134–144)
eGFR: 97 mL/min/{1.73_m2} (ref >59)

## 2021-01-08 MED ORDER — METOPROLOL TARTRATE 100 MG PO TABS: 100.0000 mg | ORAL_TABLET | Freq: Once | ORAL | 0 refills | Status: AC

## 2021-01-08 NOTE — Patient Instructions (Signed)
Medication Instructions:  PLEASE TAKE 100mg  METOPROLOL TARTRATE 2 HOURS PRIOR TO CCTA SCAN  *If you need a refill on your cardiac medications before your next appointment, please call your pharmacy*  Lab Work: BMET AND LIPID PANEL TODAY  If you have labs (blood work) drawn today and your tests are completely normal, you will receive your results only by: MyChart Message (if you have MyChart) OR A paper copy in the mail If you have any lab test that is abnormal or we need to change your treatment, we will call you to review the results.  Testing/Procedures: Your physician has requested that you have an echocardiogram. Echocardiography is a painless test that uses sound waves to create images of your heart. It provides your doctor with information about the size and shape of your heart and how well your hearts chambers and valves are working. You may receive an ultrasound enhancing agent through an IV if needed to better visualize your heart during the echo.This procedure takes approximately one hour. There are no restrictions for this procedure. This will take place at the [address].    ZIO XT- Long Term Monitor Instructions   Your physician has requested you wear your ZIO patch monitor 14 days.   This is a single patch monitor.  [employer] supplies one patch monitor per enrollment.  Additional stickers are not available.   Please do not apply patch if you will be having a Nuclear Stress Test, Echocardiogram, Cardiac CT, MRI, or Chest Xray during the time frame you would be wearing the monitor. The patch cannot be worn during these tests.  You cannot remove and re-apply the ZIO XT patch monitor.   Your ZIO patch monitor will be sent USPS Priority mail from [facility] directly to your home address. The monitor may also be mailed to a PO BOX if home delivery is not available.   It may take 3-5 days to receive your monitor after you have been enrolled.   Once you have  received you monitor, please review enclosed instructions.  Your monitor has already been registered assigning a specific monitor serial # to you.   Applying the monitor   Shave hair from upper left chest.   Hold abrader disc by orange tab.  Rub abrader in 40 strokes over left upper chest as indicated in your monitor instructions.   Clean area with 4 enclosed alcohol pads .  Use all pads to assure are is cleaned thoroughly.  Let dry.   Apply patch as indicated in monitor instructions.  Patch will be place under collarbone on left side of chest with arrow pointing upward.   Rub patch adhesive wings for 2 minutes.Remove white label marked "1".  Remove white label marked "2".  Rub patch adhesive wings for 2 additional minutes.   While looking in a mirror, press and release button in center of patch.  A small green light will flash 3-4 times .  This will be your only indicator the monitor has been turned on.     Do not shower for the first 24 hours.  You may shower after the first 24 hours.   Press button if you feel a symptom. You will hear a small click.  Record Date, Time and Symptom in the Patient Log Book.   When you are ready to remove patch, follow instructions on last 2 pages of Patient Log Book.  Stick patch monitor onto last page of Patient Log Book.  Place Patient Log Book in [place] box.  Use locking tab on box and tape box closed securely.  The [employer] has [employer] on it.  Please place in mailbox as soon as possible.  Your physician should have your test results approximately 7 days after the monitor has been mailed back to [facility].   Call [facility] Customer Care at [phone] if you have questions regarding your ZIO XT patch monitor.  Call them immediately if you see an orange light blinking on your monitor.   If your monitor falls off in less than 4 days contact our Monitor department at [phone].  If your monitor becomes loose or falls off  after 4 days call [employer] at [phone] for suggestions on securing your monitor.    Follow-Up: At [facility], you and your health needs are our priority.  As part of our continuing mission to provide you with exceptional heart care, we have created designated Provider Care Teams.  These Care Teams include your primary Cardiologist (physician) and Advanced Practice Providers (APPs -  Physician Assistants and Nurse Practitioners) who all work together to provide you with the care you need, when you need it.  Your next appointment:   AS NEEDED   The format for your next appointment:   In Person  Provider:   Little Ishikawa, MD    Other Instructions   Your cardiac CT will be scheduled at one of the below locations:   [facility] [address] [place], [place] [address] [phone]  If scheduled at [facility], please arrive at the [facility] main entrance (entrance A) of [facility] 30 minutes prior to test start time. You can use the FREE valet parking offered at the main entrance (encouraged to control the heart rate for the test) Proceed to the [facility] Radiology Department (first floor) to check-in and test prep.  Please follow these instructions carefully (unless otherwise directed):  On the Night Before the Test: Be sure to Drink plenty of water. Do not consume any caffeinated/decaffeinated beverages or chocolate 12 hours prior to your test. Do not take any antihistamines 12 hours prior to your test.  On the Day of the Test: Drink plenty of water until 1 hour prior to the test. Do not eat any food 4 hours prior to the test. You may take your regular medications prior to the test.  Take metoprolol (Lopressor) 100mg  two hours prior to test. HOLD Furosemide/Hydrochlorothiazide morning of the test. FEMALES- please wear underwire-free bra if available, avoid dresses & tight clothing      After the Test: Drink plenty of  water. After receiving IV contrast, you may experience a mild flushed feeling. This is normal. On occasion, you may experience a mild rash up to 24 hours after the test. This is not dangerous. If this occurs, you can take Benadryl 25 mg and increase your fluid intake. If you experience trouble breathing, this can be serious. If it is severe call 911 IMMEDIATELY. If it is mild, please call our office. If you take any of these medications: Glipizide/Metformin, Avandament, Glucavance, please do not take 48 hours after completing test unless otherwise instructed.  Please allow 2-4 weeks for scheduling of routine cardiac CTs. Some insurance companies require a pre-authorization which may delay scheduling of this test.   For non-scheduling related questions, please contact the cardiac imaging nurse navigator should you have any questions/concerns: , Cardiac Imaging Nurse Navigator [place] [place],  Cardiac Imaging Nurse Navigator [place] [facility] Direct Office Dial: [phone]   For scheduling needs, including cancellations and rescheduling, please call [place], [phone].

## 2021-01-08 NOTE — Progress Notes (Unsigned)
Enrolled patient for a 14 day Zio XT  monitor to be mailed to patients home  °

## 2021-01-09 ENCOUNTER — Ambulatory Visit (INDEPENDENT_AMBULATORY_CARE_PROVIDER_SITE_OTHER): Payer: BC Managed Care – PPO

## 2021-01-09 DIAGNOSIS — R072 Precordial pain: Secondary | ICD-10-CM

## 2021-01-09 DIAGNOSIS — R002 Palpitations: Secondary | ICD-10-CM | POA: Diagnosis not present

## 2021-01-09 LAB — ECHOCARDIOGRAM COMPLETE
AR max vel: 1.72 cm2
AV Area VTI: 1.78 cm2
AV Area mean vel: 1.62 cm2
AV Mean grad: 5 mmHg
AV Peak grad: 8.9 mmHg
Ao pk vel: 1.49 m/s
Area-P 1/2: 2.88 cm2
Calc EF: 57.5 %
S' Lateral: 3 cm
Single Plane A2C EF: 55.6 %
Single Plane A4C EF: 59.3 %

## 2021-01-21 DIAGNOSIS — R072 Precordial pain: Secondary | ICD-10-CM

## 2021-01-21 DIAGNOSIS — R002 Palpitations: Secondary | ICD-10-CM | POA: Diagnosis not present

## 2021-01-23 ENCOUNTER — Encounter (HOSPITAL_COMMUNITY): Payer: Self-pay

## 2021-01-23 ENCOUNTER — Ambulatory Visit (HOSPITAL_COMMUNITY): Payer: BC Managed Care – PPO
